# Patient Record
Sex: Female | Born: 1985 | Race: White | Hispanic: No | Marital: Single | Smoking: Current some day smoker
Health system: Southern US, Community
[De-identification: ages and names within clinical notes are randomized; demographics above are authoritative.]

## PROBLEM LIST (undated history)

## (undated) ENCOUNTER — Inpatient Hospital Stay (HOSPITAL_COMMUNITY): Payer: Self-pay

## (undated) DIAGNOSIS — F419 Anxiety disorder, unspecified: Secondary | ICD-10-CM

## (undated) DIAGNOSIS — G8929 Other chronic pain: Secondary | ICD-10-CM

## (undated) DIAGNOSIS — M549 Dorsalgia, unspecified: Secondary | ICD-10-CM

## (undated) DIAGNOSIS — R569 Unspecified convulsions: Secondary | ICD-10-CM

## (undated) HISTORY — PX: COSMETIC SURGERY: SHX468

## (undated) HISTORY — PX: BREAST SURGERY: SHX581

---
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
## 2005-04-13 ENCOUNTER — Emergency Department (HOSPITAL_COMMUNITY): Admission: EM | Admit: 2005-04-13 | Discharge: 2005-04-13 | Payer: Self-pay | Admitting: *Deleted

## 2007-04-18 ENCOUNTER — Inpatient Hospital Stay (HOSPITAL_COMMUNITY): Admission: AD | Admit: 2007-04-18 | Discharge: 2007-04-18 | Payer: Self-pay | Admitting: Obstetrics & Gynecology

## 2007-04-21 ENCOUNTER — Inpatient Hospital Stay (HOSPITAL_COMMUNITY): Admission: AD | Admit: 2007-04-21 | Discharge: 2007-04-21 | Payer: Self-pay | Admitting: Gynecology

## 2007-07-19 ENCOUNTER — Emergency Department (HOSPITAL_COMMUNITY): Admission: EM | Admit: 2007-07-19 | Discharge: 2007-07-19 | Payer: Self-pay | Admitting: Emergency Medicine

## 2007-11-23 ENCOUNTER — Inpatient Hospital Stay (HOSPITAL_COMMUNITY): Admission: AD | Admit: 2007-11-23 | Discharge: 2007-11-23 | Payer: Self-pay | Admitting: Obstetrics & Gynecology

## 2007-11-26 ENCOUNTER — Inpatient Hospital Stay (HOSPITAL_COMMUNITY): Admission: AD | Admit: 2007-11-26 | Discharge: 2007-11-26 | Payer: Self-pay | Admitting: Obstetrics & Gynecology

## 2007-12-04 ENCOUNTER — Inpatient Hospital Stay (HOSPITAL_COMMUNITY): Admission: RE | Admit: 2007-12-04 | Discharge: 2007-12-04 | Payer: Self-pay | Admitting: Obstetrics & Gynecology

## 2008-03-08 ENCOUNTER — Ambulatory Visit (HOSPITAL_COMMUNITY): Admission: RE | Admit: 2008-03-08 | Discharge: 2008-03-08 | Payer: Self-pay | Admitting: Obstetrics & Gynecology

## 2008-06-27 ENCOUNTER — Ambulatory Visit: Payer: Self-pay | Admitting: Vascular Surgery

## 2008-06-27 ENCOUNTER — Ambulatory Visit (HOSPITAL_COMMUNITY): Admission: RE | Admit: 2008-06-27 | Discharge: 2008-06-27 | Payer: Self-pay | Admitting: Obstetrics & Gynecology

## 2008-06-27 ENCOUNTER — Encounter: Payer: Self-pay | Admitting: Obstetrics & Gynecology

## 2008-07-28 ENCOUNTER — Inpatient Hospital Stay (HOSPITAL_COMMUNITY): Admission: AD | Admit: 2008-07-28 | Discharge: 2008-07-28 | Payer: Self-pay | Admitting: Obstetrics

## 2008-07-28 ENCOUNTER — Inpatient Hospital Stay (HOSPITAL_COMMUNITY): Admission: AD | Admit: 2008-07-28 | Discharge: 2008-07-31 | Payer: Self-pay | Admitting: Obstetrics

## 2009-03-21 ENCOUNTER — Emergency Department (HOSPITAL_COMMUNITY): Admission: EM | Admit: 2009-03-21 | Discharge: 2009-03-22 | Payer: Self-pay | Admitting: Emergency Medicine

## 2010-07-03 LAB — CBC
HCT: 36.2 % (ref 36.0–46.0)
Hemoglobin: 12.6 g/dL (ref 12.0–15.0)
MCHC: 34.7 g/dL (ref 30.0–36.0)
MCHC: 34.9 g/dL (ref 30.0–36.0)
MCV: 97.9 fL (ref 78.0–100.0)
MCV: 98.4 fL (ref 78.0–100.0)
Platelets: 167 10*3/uL (ref 150–400)
Platelets: 245 10*3/uL (ref 150–400)
RBC: 3.67 MIL/uL — ABNORMAL LOW (ref 3.87–5.11)
RDW: 13.9 % (ref 11.5–15.5)
WBC: 11.6 10*3/uL — ABNORMAL HIGH (ref 4.0–10.5)

## 2010-07-04 ENCOUNTER — Emergency Department (HOSPITAL_COMMUNITY)
Admission: EM | Admit: 2010-07-04 | Discharge: 2010-07-05 | Disposition: A | Discharge status: Home / Self Care | Payer: Self-pay | Attending: Emergency Medicine | Admitting: Emergency Medicine

## 2010-07-04 DIAGNOSIS — R262 Difficulty in walking, not elsewhere classified: Secondary | ICD-10-CM | POA: Insufficient documentation

## 2010-07-04 DIAGNOSIS — M546 Pain in thoracic spine: Secondary | ICD-10-CM | POA: Insufficient documentation

## 2010-07-04 DIAGNOSIS — G40909 Epilepsy, unspecified, not intractable, without status epilepticus: Secondary | ICD-10-CM | POA: Insufficient documentation

## 2010-07-04 DIAGNOSIS — Z79899 Other long term (current) drug therapy: Secondary | ICD-10-CM | POA: Insufficient documentation

## 2010-07-04 DIAGNOSIS — H6690 Otitis media, unspecified, unspecified ear: Secondary | ICD-10-CM | POA: Insufficient documentation

## 2010-07-09 ENCOUNTER — Other Ambulatory Visit: Payer: Self-pay | Admitting: Orthopedic Surgery

## 2010-07-09 DIAGNOSIS — M545 Low back pain: Secondary | ICD-10-CM

## 2010-07-09 DIAGNOSIS — M549 Dorsalgia, unspecified: Secondary | ICD-10-CM

## 2010-07-16 ENCOUNTER — Other Ambulatory Visit: Payer: Self-pay

## 2010-07-21 ENCOUNTER — Ambulatory Visit
Admission: RE | Admit: 2010-07-21 | Discharge: 2010-07-21 | Disposition: A | Discharge status: Home / Self Care | Payer: Self-pay | Source: Ambulatory Visit | Attending: Orthopedic Surgery | Admitting: Orthopedic Surgery

## 2010-07-21 DIAGNOSIS — M545 Low back pain: Secondary | ICD-10-CM

## 2010-07-21 DIAGNOSIS — M549 Dorsalgia, unspecified: Secondary | ICD-10-CM

## 2010-09-14 ENCOUNTER — Emergency Department (HOSPITAL_COMMUNITY)
Admission: EM | Admit: 2010-09-14 | Discharge: 2010-09-14 | Disposition: A | Discharge status: Home / Self Care | Payer: BC Managed Care – PPO | Attending: Emergency Medicine | Admitting: Emergency Medicine

## 2010-09-14 ENCOUNTER — Emergency Department (HOSPITAL_COMMUNITY): Payer: BC Managed Care – PPO

## 2010-09-14 DIAGNOSIS — M62838 Other muscle spasm: Secondary | ICD-10-CM | POA: Insufficient documentation

## 2010-09-14 DIAGNOSIS — M25579 Pain in unspecified ankle and joints of unspecified foot: Secondary | ICD-10-CM | POA: Insufficient documentation

## 2010-09-14 DIAGNOSIS — M549 Dorsalgia, unspecified: Secondary | ICD-10-CM | POA: Insufficient documentation

## 2010-09-14 DIAGNOSIS — G8929 Other chronic pain: Secondary | ICD-10-CM | POA: Insufficient documentation

## 2010-12-14 LAB — URINALYSIS, ROUTINE W REFLEX MICROSCOPIC
Bilirubin Urine: NEGATIVE
Glucose, UA: NEGATIVE
Hgb urine dipstick: NEGATIVE
Ketones, ur: NEGATIVE
Nitrite: NEGATIVE
Protein, ur: NEGATIVE
Specific Gravity, Urine: >1.03 — ABNORMAL HIGH
Urobilinogen, UA: 0.2
pH: 5.5

## 2010-12-14 LAB — GC/CHLAMYDIA PROBE AMP, GENITAL
Chlamydia, DNA Probe: NEGATIVE
GC Probe Amp, Genital: NEGATIVE

## 2010-12-14 LAB — WET PREP, GENITAL
Clue Cells Wet Prep HPF POC: NONE SEEN
Trich, Wet Prep: NONE SEEN
Yeast Wet Prep HPF POC: NONE SEEN

## 2010-12-14 LAB — POCT PREGNANCY, URINE
Operator id: 127531
Preg Test, Ur: POSITIVE

## 2010-12-14 LAB — CBC
HCT: 35.9 — ABNORMAL LOW
Hemoglobin: 12.6
Platelets: 280
RDW: 12.2

## 2010-12-14 LAB — ABO/RH: ABO/RH(D): O POS

## 2012-04-09 ENCOUNTER — Encounter (HOSPITAL_COMMUNITY): Payer: Self-pay

## 2012-04-09 ENCOUNTER — Emergency Department (HOSPITAL_COMMUNITY)
Admission: EM | Admit: 2012-04-09 | Discharge: 2012-04-09 | Disposition: A | Discharge status: Home / Self Care | Payer: Self-pay | Attending: Emergency Medicine | Admitting: Emergency Medicine

## 2012-04-09 DIAGNOSIS — F17200 Nicotine dependence, unspecified, uncomplicated: Secondary | ICD-10-CM | POA: Insufficient documentation

## 2012-04-09 DIAGNOSIS — M545 Low back pain, unspecified: Secondary | ICD-10-CM | POA: Insufficient documentation

## 2012-04-09 DIAGNOSIS — F411 Generalized anxiety disorder: Secondary | ICD-10-CM | POA: Insufficient documentation

## 2012-04-09 DIAGNOSIS — Z760 Encounter for issue of repeat prescription: Secondary | ICD-10-CM | POA: Insufficient documentation

## 2012-04-09 DIAGNOSIS — Z79899 Other long term (current) drug therapy: Secondary | ICD-10-CM | POA: Insufficient documentation

## 2012-04-09 DIAGNOSIS — G8929 Other chronic pain: Secondary | ICD-10-CM | POA: Insufficient documentation

## 2012-04-09 DIAGNOSIS — Z9889 Other specified postprocedural states: Secondary | ICD-10-CM | POA: Insufficient documentation

## 2012-04-09 HISTORY — DX: Anxiety disorder, unspecified: F41.9

## 2012-04-09 HISTORY — DX: Dorsalgia, unspecified: M54.9

## 2012-04-09 HISTORY — DX: Other chronic pain: G89.29

## 2012-04-09 MED ORDER — DIAZEPAM 5 MG PO TABS
5.0000 mg | ORAL_TABLET | Freq: Once | ORAL | Status: AC
  Administered 2012-04-09: 5 mg via ORAL
  Filled 2012-04-09: qty 1

## 2012-04-09 MED ORDER — IBUPROFEN 200 MG PO TABS
600.0000 mg | ORAL_TABLET | Freq: Once | ORAL | Status: AC
  Administered 2012-04-09: 600 mg via ORAL
  Filled 2012-04-09: qty 1

## 2012-04-09 MED ORDER — GABAPENTIN 800 MG PO TABS: 800.0000 mg | ORAL_TABLET | Freq: Three times a day (TID) | ORAL | Status: DC

## 2012-04-09 MED ORDER — HYDROMORPHONE HCL PF 1 MG/ML IJ SOLN
1.0000 mg | Freq: Once | INTRAMUSCULAR | Status: AC
  Administered 2012-04-09: 1 mg via INTRAMUSCULAR
  Filled 2012-04-09: qty 1

## 2012-04-09 NOTE — ED Notes (Signed)
Pt c/o chronic back pain. Needs refill for pain rx. Pt states current PCP unable to prescribe her requested medication oxycodone 30mg  and was told to come to ED. She is waiting to be seen at pain center in possible 2-3 weeks. A&Ox4, ambulatory, nad.

## 2012-04-09 NOTE — ED Notes (Addendum)
Pt presents with chronic thoracic and lumbar pain.  Pt reports she was just at her physicians' office, denied prescription for oxycodone 30mg  until she can get into pain clinic.  Pt denies any recent injury, denies any bowel or fecal incontinence.  Pain medication last taken yesterday morning.

## 2012-04-09 NOTE — ED Provider Notes (Signed)
History    27 year old female with lower back pain. Chronic in nature and has been bothering her for a period of several years. Went to her primary care physician's office today to get a refill of her oxycodone, 30 mg, but was denied and reports she was referred to the emergency room. She denies any acute trauma. No urinary complaints. No acute numbness, tingling or loss of strength. No use of blood thinning medication. Denies history of IV drug use.  CSN: [id number]  Arrival date & time 04/09/12  1259   First MD Initiated Contact with Patient 04/09/12 1404      Chief Complaint  Patient presents with  . Back Pain    (Consider location/radiation/quality/duration/timing/severity/associated sxs/prior treatment) HPI  Past Medical History  Diagnosis Date  . Back pain, chronic   . Anxiety     Past Surgical History  Procedure Date  . Back surgery   . Cosmetic surgery     History reviewed. No pertinent family history.  History  Substance Use Topics  . Smoking status: Current Some Day Smoker  . Smokeless tobacco: Not on file  . Alcohol Use: No    OB History    Grav Para Term Preterm Abortions TAB SAB Ect Mult Living                  Review of Systems  All systems reviewed and negative, other than as noted in HPI.\  Allergies  Acetaminophen  Home Medications   Current Outpatient Rx  Name  Route  Sig  Dispense  Refill  . ALPRAZOLAM 1 MG PO TABS   Oral   Take 1 mg by mouth 2 (two) times daily as needed. For anxiety         . ADULT MULTIVITAMIN W/MINERALS CH   Oral   Take 1 tablet by mouth daily.         Marland Kitchen GABAPENTIN 800 MG PO TABS   Oral   Take 1 tablet (800 mg total) by mouth 3 (three) times daily.   90 tablet   0     BP 128/91  Pulse 79  Temp 98.2 F (36.8 C) (Oral)  Resp 16  SpO2 100%  LMP 03/18/2012  Physical Exam  Nursing note and vitals reviewed. Constitutional: She appears well-developed and well-nourished. No distress.  HENT:  Head:  Normocephalic and atraumatic.  Eyes: Conjunctivae normal are normal. Right eye exhibits no discharge. Left eye exhibits no discharge.  Neck: Neck supple.  Cardiovascular: Normal rate, regular rhythm and normal heart sounds.  Exam reveals no gallop and no friction rub.   No murmur heard. Pulmonary/Chest: Effort normal and breath sounds normal. No respiratory distress.  Abdominal: Soft. She exhibits no distension. There is no tenderness.  Musculoskeletal: She exhibits no edema and no tenderness.       Pain is not reproducible. Tattoo in lumbosacral region, but no concerning skin changes noted.  Neurological: She is alert. She exhibits normal muscle tone. Coordination normal.       Strength 5 out of 5 bilateral lower extremities. Sensation is intact to light touch. Patellar reflexes 1+ bilaterally  Skin: Skin is warm and dry.  Psychiatric: She has a normal mood and affect. Her behavior is normal. Thought content normal.    ED Course  Procedures (including critical care time)  Labs Reviewed - No data to display No results found.   1. Chronic back pain   2. Medication refill       MDM  26  rolled female with chronic lower back pain without acute change. No concerning "red flags." History resting refill for narcotic pain medicine. Discussed with patient that I do not write prescriptions for her chronic narcotics in that she needs to followup with pain management as apparently was recommended by her PCP. She is in no acute distress. She did receive a dose of pain medications in the emergency room though. Also prescribed neurontin as she reports that she's previously been on this dose with some relief in the past. Emergent return precautions were discussed. Outpatient followup otherwise.        Raeford Razor, MD 04/09/12 [phone]

## 2012-04-27 ENCOUNTER — Ambulatory Visit: Payer: Self-pay | Admitting: Family Medicine

## 2012-04-27 ENCOUNTER — Telehealth: Payer: Self-pay | Admitting: Family Medicine

## 2012-04-27 VITALS — BP 121/87 | HR 77 | Temp 98.2°F | Resp 18 | Ht 65.0 in | Wt 133.0 lb

## 2012-04-27 DIAGNOSIS — M549 Dorsalgia, unspecified: Secondary | ICD-10-CM

## 2012-04-27 DIAGNOSIS — G8929 Other chronic pain: Secondary | ICD-10-CM

## 2012-04-27 MED ORDER — OXYCODONE HCL 30 MG PO TABS: 30.0000 mg | ORAL_TABLET | Freq: Four times a day (QID) | ORAL | Status: DC | PRN

## 2012-04-27 NOTE — Telephone Encounter (Signed)
[facility] called regarding pts RX for oxycodone. They will not be able to fill this for her. There is a note in there system that states Rx did not meet good faith dispensing on 06/2011. She has been getting this RX in [place], [place], and even in [place]. They will have to have patient fill somewhere else.
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
## 2012-05-27 ENCOUNTER — Emergency Department (HOSPITAL_COMMUNITY)
Admission: EM | Admit: 2012-05-27 | Discharge: 2012-05-27 | Disposition: A | Discharge status: Home / Self Care | Payer: Self-pay | Attending: Emergency Medicine | Admitting: Emergency Medicine

## 2012-05-27 ENCOUNTER — Encounter (HOSPITAL_COMMUNITY): Payer: Self-pay | Admitting: Emergency Medicine

## 2012-05-27 DIAGNOSIS — F411 Generalized anxiety disorder: Secondary | ICD-10-CM | POA: Insufficient documentation

## 2012-05-27 DIAGNOSIS — Z9889 Other specified postprocedural states: Secondary | ICD-10-CM | POA: Insufficient documentation

## 2012-05-27 DIAGNOSIS — F17200 Nicotine dependence, unspecified, uncomplicated: Secondary | ICD-10-CM | POA: Insufficient documentation

## 2012-05-27 DIAGNOSIS — Z87828 Personal history of other (healed) physical injury and trauma: Secondary | ICD-10-CM | POA: Insufficient documentation

## 2012-05-27 DIAGNOSIS — G8929 Other chronic pain: Secondary | ICD-10-CM | POA: Insufficient documentation

## 2012-05-27 DIAGNOSIS — M549 Dorsalgia, unspecified: Secondary | ICD-10-CM | POA: Insufficient documentation

## 2012-05-27 DIAGNOSIS — R209 Unspecified disturbances of skin sensation: Secondary | ICD-10-CM | POA: Insufficient documentation

## 2012-05-27 MED ORDER — OXYCODONE HCL ER 10 MG PO T12A
10.0000 mg | EXTENDED_RELEASE_TABLET | Freq: Two times a day (BID) | ORAL | Status: DC
  Administered 2012-05-27: 10 mg via ORAL
  Filled 2012-05-27: qty 1

## 2012-05-27 NOTE — ED Provider Notes (Signed)
  Medical screening examination/treatment/procedure(s) were performed by non-physician practitioner and as supervising physician I was immediately available for consultation/collaboration.    Vida Roller, MD 05/27/12 [phone]
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
## 2012-05-27 NOTE — ED Notes (Signed)
Pt states she was told to come her for medication refill of Oxycodone 30mg  for chronic back pain until she can get into the pain clinic.

## 2012-06-23 ENCOUNTER — Emergency Department (HOSPITAL_COMMUNITY)
Admission: EM | Admit: 2012-06-23 | Discharge: 2012-06-23 | Disposition: A | Discharge status: Home / Self Care | Payer: Self-pay | Attending: Emergency Medicine | Admitting: Emergency Medicine

## 2012-06-23 ENCOUNTER — Encounter (HOSPITAL_COMMUNITY): Payer: Self-pay | Admitting: Emergency Medicine

## 2012-06-23 DIAGNOSIS — M549 Dorsalgia, unspecified: Secondary | ICD-10-CM | POA: Insufficient documentation

## 2012-06-23 DIAGNOSIS — F19939 Other psychoactive substance use, unspecified with withdrawal, unspecified: Secondary | ICD-10-CM | POA: Insufficient documentation

## 2012-06-23 DIAGNOSIS — F17200 Nicotine dependence, unspecified, uncomplicated: Secondary | ICD-10-CM | POA: Insufficient documentation

## 2012-06-23 DIAGNOSIS — F13239 Sedative, hypnotic or anxiolytic dependence with withdrawal, unspecified: Secondary | ICD-10-CM

## 2012-06-23 DIAGNOSIS — F411 Generalized anxiety disorder: Secondary | ICD-10-CM | POA: Insufficient documentation

## 2012-06-23 DIAGNOSIS — G40909 Epilepsy, unspecified, not intractable, without status epilepticus: Secondary | ICD-10-CM | POA: Insufficient documentation

## 2012-06-23 DIAGNOSIS — Z79899 Other long term (current) drug therapy: Secondary | ICD-10-CM | POA: Insufficient documentation

## 2012-06-23 DIAGNOSIS — G8929 Other chronic pain: Secondary | ICD-10-CM | POA: Insufficient documentation

## 2012-06-23 DIAGNOSIS — R569 Unspecified convulsions: Secondary | ICD-10-CM

## 2012-06-23 MED ORDER — OXYCODONE-ACETAMINOPHEN 5-325 MG PO TABS
2.0000 | ORAL_TABLET | Freq: Once | ORAL | Status: AC
  Administered 2012-06-23: 2 via ORAL
  Filled 2012-06-23: qty 2

## 2012-06-23 MED ORDER — LORAZEPAM 1 MG PO TABS: 1.0000 mg | ORAL_TABLET | Freq: Every day | ORAL | Status: DC

## 2012-06-23 MED ORDER — LORAZEPAM 2 MG/ML IJ SOLN
1.0000 mg | Freq: Once | INTRAMUSCULAR | Status: AC
  Administered 2012-06-23: 1 mg via INTRAVENOUS
  Filled 2012-06-23: qty 1

## 2012-06-23 NOTE — ED Provider Notes (Signed)
History     CSN: [id number]  Arrival date & time 06/23/12  1147   First MD Initiated Contact with Patient 06/23/12 1205      Chief Complaint  Patient presents with  . Seizures    (Consider location/radiation/quality/duration/timing/severity/associated sxs/prior treatment) HPI Comments: Patients with history of anxiety, chronic pain presents with complaint of seizure. Patient just arrived at work and was found lying on the floor. EMS was called. She was postictal and confused upon EMS arrival. She bit her tongue but was not incontinent. 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 Patient thinks this was triggered by the same. She recently cut back on the amount of Xanax she was taking because she is almost out. Her last dose was yesterday 0.5 mg. She typically takes 1-2 mg every day. Patient denies chronic alcohol use. She currently does not have any symptoms or pain. No weakness, numbness, or tingling in her extremities. She complains of her baseline lower back pain. She has an abrasion above her right eye but denies vision changes or vomiting. No headache. Onset acute. Course is resolved. Nothing makes symptoms better.  The history is provided by the patient.    Past Medical History  Diagnosis Date  . Back pain, chronic   . Anxiety     Past Surgical History  Procedure Laterality Date  . Back surgery    . Cosmetic surgery    . Breast surgery      Family History  Problem Relation Age of Onset  . Diabetes Maternal Grandmother   . Kidney disease Maternal Grandmother     History  Substance Use Topics  . Smoking status: Current Some Day Smoker  . Smokeless tobacco: Not on file  . Alcohol Use: No    OB History   Grav Para Term Preterm Abortions TAB SAB Ect Mult Living                  Review of Systems  Constitutional: Negative for fever.  HENT: Negative for congestion, rhinorrhea, neck pain, neck stiffness, dental problem and sinus  pressure.   Eyes: Negative for photophobia, discharge, redness and visual disturbance.  Respiratory: Negative for shortness of breath.   Cardiovascular: Negative for chest pain.  Gastrointestinal: Negative for nausea and vomiting.  Musculoskeletal: Negative for gait problem.  Skin: Positive for wound (tongue biting). Negative for rash.  Neurological: Positive for seizures. Negative for syncope, speech difficulty, weakness, light-headedness, numbness and headaches.  Psychiatric/Behavioral: Negative for confusion.    Allergies  Acetaminophen  Home Medications   Current Outpatient Rx  Name  Route  Sig  Dispense  Refill  . ALPRAZolam (XANAX) 1 MG tablet   Oral   Take 1 mg by mouth 2 (two) times daily as needed. For anxiety         . gabapentin (NEURONTIN) 800 MG tablet   Oral   Take 800 mg by mouth 3 (three) times daily as needed (For pain.).         Marland Kitchen Multiple Vitamin (MULTIVITAMIN WITH MINERALS) TABS   Oral   Take 1 tablet by mouth every morning.          Marland Kitchen oxycodone (ROXICODONE) 30 MG immediate release tablet   Oral   Take 30 mg by mouth every 6 (six) hours as needed for pain.           BP 133/92  Pulse 110  Temp(Src) 98.4 F (36.9 C) (Oral)  Resp 20  SpO2 100%  LMP 06/10/2012  Physical Exam  Nursing note and vitals reviewed. Constitutional: She is oriented to person, place, and time. She appears well-developed and well-nourished.  HENT:  Head: Normocephalic and atraumatic.  Right Ear: Tympanic membrane, external ear and ear canal normal.  Left Ear: Tympanic membrane, external ear and ear canal normal.  Nose: Nose normal.  Mouth/Throat: Uvula is midline, oropharynx is clear and moist and mucous membranes are normal.  Abrasions bilateral lateral tongue  Eyes: Conjunctivae, EOM and lids are normal. Pupils are equal, round, and reactive to light. Right eye exhibits no discharge. Left eye exhibits no discharge. Right eye exhibits no nystagmus. Left eye  exhibits no nystagmus.  Neck: Normal range of motion. Neck supple.  Cardiovascular: Normal rate, regular rhythm and normal heart sounds.   Pulmonary/Chest: Effort normal and breath sounds normal.  Abdominal: Soft. There is no tenderness.  Musculoskeletal:       Cervical back: She exhibits normal range of motion, no tenderness and no bony tenderness.  Neurological: She is alert and oriented to person, place, and time. She has normal strength and normal reflexes. No cranial nerve deficit or sensory deficit. She displays a negative Romberg sign. Coordination and gait normal. GCS eye subscore is 4. GCS verbal subscore is 5. GCS motor subscore is 6.  Skin: Skin is warm and dry.  Psychiatric: She has a normal mood and affect.    ED Course  Procedures (including critical care time)  Labs Reviewed - No data to display No results found.   1. Seizure   2. Benzodiazepine withdrawal     1:12 PM Patient seen and examined. Work-up initiated. Medications ordered.   Vital signs reviewed and are as follows: Filed Vitals:   06/23/12 1253  BP:   Pulse:   Temp: 98.8 F (37.1 C)  Resp:   BP 133/92  Pulse 110  Temp(Src) 98.8 F (37.1 C) (Oral)  Resp 20  SpO2 100%  LMP 06/10/2012  2:58 PM Patient d/w Dr. Blinda Leatherwood. Patient has not had additional seizure activity. HR 100. Will give ativan for home. Urged to f/u with PCP ASAP for benzo mgmt.   Patient requested pain medication since she is out. Will not rx given h/o chronic pain. This will need to be managed by PCP.    MDM  Seizure, most likely secondary to benzodiazepine withdrawal. Patient with full return to baseline in emergency department. No further seizure activity noted. Neurological exam is normal. Ativan 1mg  #15 provided to prevent further withdrawal seizures.          Renne Crigler, PA-C 06/23/12 1501

## 2012-06-23 NOTE — ED Notes (Addendum)
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 EMS stated "pt seemed post ictal.  Pt has hx of withdrawal seizures"  Pt reports she doesn't remember what happened.  Arrived to ED with a small abrasion above r eye. Pt reports that previous seizures have been related to xanax withdrawal and has recently ran out of xanax.  Pt is alert and oriented.

## 2012-06-23 NOTE — ED Notes (Signed)
LKG:MW10<UV> Expected date:<BR> Expected time:<BR> Means of arrival:<BR> Comments:<BR> 27 y/o F postictal/ tachy @ 120

## 2012-06-25 NOTE — ED Provider Notes (Signed)
Medical screening examination/treatment/procedure(s) were performed by non-physician practitioner and as supervising physician I was immediately available for consultation/collaboration.  Gilda Crease, MD 06/25/12 1300

## 2012-07-27 ENCOUNTER — Emergency Department (HOSPITAL_COMMUNITY)
Admission: EM | Admit: 2012-07-27 | Discharge: 2012-07-27 | Disposition: A | Discharge status: Home / Self Care | Payer: Self-pay | Attending: Emergency Medicine | Admitting: Emergency Medicine

## 2012-07-27 ENCOUNTER — Encounter (HOSPITAL_COMMUNITY): Payer: Self-pay | Admitting: Emergency Medicine

## 2012-07-27 DIAGNOSIS — F19939 Other psychoactive substance use, unspecified with withdrawal, unspecified: Secondary | ICD-10-CM | POA: Insufficient documentation

## 2012-07-27 DIAGNOSIS — M549 Dorsalgia, unspecified: Secondary | ICD-10-CM | POA: Insufficient documentation

## 2012-07-27 DIAGNOSIS — G8929 Other chronic pain: Secondary | ICD-10-CM | POA: Insufficient documentation

## 2012-07-27 DIAGNOSIS — F17200 Nicotine dependence, unspecified, uncomplicated: Secondary | ICD-10-CM | POA: Insufficient documentation

## 2012-07-27 DIAGNOSIS — G43909 Migraine, unspecified, not intractable, without status migrainosus: Secondary | ICD-10-CM | POA: Insufficient documentation

## 2012-07-27 DIAGNOSIS — Z760 Encounter for issue of repeat prescription: Secondary | ICD-10-CM | POA: Insufficient documentation

## 2012-07-27 DIAGNOSIS — Z79899 Other long term (current) drug therapy: Secondary | ICD-10-CM | POA: Insufficient documentation

## 2012-07-27 MED ORDER — CETIRIZINE HCL 10 MG PO TABS: 10.0000 mg | ORAL_TABLET | Freq: Every day | ORAL | Status: DC

## 2012-07-27 MED ORDER — KETOROLAC TROMETHAMINE 60 MG/2ML IM SOLN
60.0000 mg | Freq: Once | INTRAMUSCULAR | Status: AC
  Administered 2012-07-27: 60 mg via INTRAMUSCULAR
  Filled 2012-07-27: qty 2

## 2012-07-27 MED ORDER — LORAZEPAM 1 MG PO TABS
1.0000 mg | ORAL_TABLET | Freq: Once | ORAL | Status: AC
  Administered 2012-07-27: 1 mg via ORAL
  Filled 2012-07-27: qty 1

## 2012-07-27 MED ORDER — ALPRAZOLAM 1 MG PO TABS: 1.0000 mg | ORAL_TABLET | Freq: Three times a day (TID) | ORAL | Status: DC | PRN

## 2012-07-27 NOTE — ED Provider Notes (Signed)
History    This chart was scribed for non-physician practitioner Jaynie Crumble, PA-C working with Toy Baker, MD by Gerlean Ren, ED Scribe. This patient was seen in room WTR8/WTR8 and the patient's care was started at 11:15 PM.    CSN: [id number]  Arrival date & time 07/27/12  2231   First MD Initiated Contact with Patient 07/27/12 2252      Chief Complaint  Patient presents with  . Anxiety  . Medication Refill     The history is provided by the patient. No language interpreter was used.  Colleen Evans is a 27 y.o. female who presents to the Emergency Department complaining of anxiety with associated dizziness since running out of Xanax yesterday.  Pt normally takes 2-3 1mg  Xanax daily.  Xanax normally prescribed by psychiatrist.  Pt states she "does not know what happened" to her Xanax.  Pt reports h/o withdrawal seizures which have not occurred with current symptoms.  Next appointment with psychiatrist is 05/17.   Pt also c/o 3 months of occasional throbbing HA with associated photophobia, watery eyes, and congestion that she thinks are related to allergies.    No PCP. Past Medical History  Diagnosis Date  . Back pain, chronic   . Anxiety     Past Surgical History  Procedure Laterality Date  . Back surgery    . Cosmetic surgery    . Breast surgery      Family History  Problem Relation Age of Onset  . Diabetes Maternal Grandmother   . Kidney disease Maternal Grandmother     History  Substance Use Topics  . Smoking status: Current Some Day Smoker  . Smokeless tobacco: Not on file  . Alcohol Use: No    No OB history provided.   Review of Systems  Constitutional: Negative for fever and chills.  HENT: Positive for congestion.   Eyes: Positive for photophobia.       Positive watery eyes  Respiratory: Negative for shortness of breath.   Gastrointestinal: Negative for nausea and vomiting.  Neurological: Positive for dizziness and headaches. Negative for  weakness.  Psychiatric/Behavioral: The patient is nervous/anxious.   All other systems reviewed and are negative.    Allergies  Acetaminophen  Home Medications   Current Outpatient Rx  Name  Route  Sig  Dispense  Refill  . ALPRAZolam (XANAX) 1 MG tablet   Oral   Take 1 mg by mouth 3 (three) times daily as needed for anxiety. For anxiety         . amphetamine-dextroamphetamine (ADDERALL) 20 MG tablet   Oral   Take 20 mg by mouth daily.         . Multiple Vitamin (MULTIVITAMIN WITH MINERALS) TABS   Oral   Take 1 tablet by mouth every morning.            BP 133/87  Pulse 74  Temp(Src) 98.6 F (37 C) (Oral)  SpO2 100%  LMP 07/05/2012  Physical Exam  Nursing note and vitals reviewed. Constitutional: She is oriented to person, place, and time. She appears well-developed and well-nourished.  HENT:  Head: Normocephalic and atraumatic.  Mouth/Throat: Oropharynx is clear and moist.  Bilateral TM normal  Eyes: Conjunctivae and EOM are normal. Pupils are equal, round, and reactive to light.  Neck: Normal range of motion. Neck supple. No tracheal deviation present.  Cardiovascular: Normal rate, regular rhythm and normal heart sounds.   Pulmonary/Chest: Effort normal. No respiratory distress. She has no wheezes. She  has no rales.  Musculoskeletal: Normal range of motion.  Neurological: She is alert and oriented to person, place, and time.  5/5 and equal upper and lower extremity strength bilaterally. Equal grip strength bilaterally. Normal finger to nose and heel to shin. No pronator drift.   Skin: Skin is warm and dry.  Psychiatric:  Pt is tearful, appears anxious    ED Course  Procedures (including critical care time) DIAGNOSTIC STUDIES: Oxygen Saturation is 100% on room air, normal by my interpretation.    COORDINATION OF CARE: 11:21 PM- Offered pt IM Toradol for HA and she accepts.  Informed pt I can only write short term Xanax.  Advised pt to make contact with  PCP.  Pt verbalizes understanding and agrees with plan.    1. Migraine   2. Medication withdrawal       MDM  Pt states she does not know where her xanax is. States her apt is not for another week. She has hx of seizures when withdrawing from benzos and worried will have another seizure. Pt also admits to seasonal allergies and headaches. Pt non toxic. Normal neuro exam. She does appear anxious and tearful. Will refill small number of xanax, also wills tart on zyrtec for allergies, follow up with PCP.   Filed Vitals:   07/27/12 2246  BP: 133/87  Pulse: 74  Temp: 98.6 F (37 C)  TempSrc: Oral  SpO2: 100%     I personally performed the services described in this documentation, which was scribed in my presence. The recorded information has been reviewed and is accurate.    Lottie Mussel, PA-C 07/28/12 [phone]

## 2012-07-27 NOTE — ED Notes (Signed)
Pt states that she is out of her Xanax, hasn't had one since yesterday and states hx of withdrawal seizures.

## 2012-07-29 NOTE — ED Provider Notes (Signed)
Medical screening examination/treatment/procedure(s) were performed by non-physician practitioner and as supervising physician I was immediately available for consultation/collaboration.  Jeffrie Lofstrom, MD 07/29/12 0055 

## 2012-10-20 ENCOUNTER — Emergency Department (HOSPITAL_COMMUNITY)
Admission: EM | Admit: 2012-10-20 | Discharge: 2012-10-20 | Disposition: A | Discharge status: Home / Self Care | Payer: Self-pay | Attending: Emergency Medicine | Admitting: Emergency Medicine

## 2012-10-20 ENCOUNTER — Encounter (HOSPITAL_COMMUNITY): Payer: Self-pay | Admitting: Emergency Medicine

## 2012-10-20 DIAGNOSIS — M549 Dorsalgia, unspecified: Secondary | ICD-10-CM | POA: Insufficient documentation

## 2012-10-20 DIAGNOSIS — F411 Generalized anxiety disorder: Secondary | ICD-10-CM | POA: Insufficient documentation

## 2012-10-20 DIAGNOSIS — F19939 Other psychoactive substance use, unspecified with withdrawal, unspecified: Secondary | ICD-10-CM | POA: Insufficient documentation

## 2012-10-20 DIAGNOSIS — F13239 Sedative, hypnotic or anxiolytic dependence with withdrawal, unspecified: Secondary | ICD-10-CM

## 2012-10-20 DIAGNOSIS — Z79899 Other long term (current) drug therapy: Secondary | ICD-10-CM | POA: Insufficient documentation

## 2012-10-20 DIAGNOSIS — F17200 Nicotine dependence, unspecified, uncomplicated: Secondary | ICD-10-CM | POA: Insufficient documentation

## 2012-10-20 DIAGNOSIS — R569 Unspecified convulsions: Secondary | ICD-10-CM | POA: Insufficient documentation

## 2012-10-20 DIAGNOSIS — G8929 Other chronic pain: Secondary | ICD-10-CM | POA: Insufficient documentation

## 2012-10-20 DIAGNOSIS — F1320 Sedative, hypnotic or anxiolytic dependence, uncomplicated: Secondary | ICD-10-CM | POA: Insufficient documentation

## 2012-10-20 HISTORY — DX: Unspecified convulsions: R56.9

## 2012-10-20 LAB — POCT I-STAT, CHEM 8
BUN: 6 mg/dL (ref 6–23)
Calcium, Ion: 1.15 mmol/L (ref 1.12–1.23)
Chloride: 105 mEq/L (ref 96–112)
HCT: 42 % (ref 36.0–46.0)
Potassium: 3.6 mEq/L (ref 3.5–5.1)

## 2012-10-20 MED ORDER — ALPRAZOLAM 1 MG PO TABS: 1.0000 mg | ORAL_TABLET | Freq: Every evening | ORAL | Status: DC | PRN

## 2012-10-20 MED ORDER — IBUPROFEN 800 MG PO TABS
800.0000 mg | ORAL_TABLET | Freq: Once | ORAL | Status: AC
  Administered 2012-10-20: 800 mg via ORAL

## 2012-10-20 MED ORDER — IBUPROFEN 800 MG PO TABS
800.0000 mg | ORAL_TABLET | Freq: Once | ORAL | Status: DC
  Filled 2012-10-20: qty 1

## 2012-10-20 MED ORDER — LORAZEPAM 2 MG/ML IJ SOLN
1.0000 mg | Freq: Once | INTRAMUSCULAR | Status: AC
  Administered 2012-10-20: 1 mg via INTRAVENOUS
  Filled 2012-10-20: qty 1

## 2012-10-20 NOTE — ED Notes (Signed)
Pt was at work when she had a witnessed seizure. Witnesses state pt passed out and hit head on floor. Small bruise noted above L eyebrow. Pt states last seizure was almost a year ago. Was dx with seizures 7 years ago. Not on medications for seizures.

## 2012-10-20 NOTE — ED Provider Notes (Signed)
Medical screening examination/treatment/procedure(s) were performed by non-physician practitioner and as supervising physician I was immediately available for consultation/collaboration.   Treson Laura, MD 10/20/12 2304 

## 2012-10-20 NOTE — ED Notes (Signed)
[id number]<AV> Expected date:<BR> Expected time:<BR> Means of arrival:<BR> Comments:<BR> EMS-sz

## 2012-10-20 NOTE — ED Provider Notes (Signed)
CSN: [id number]     Arrival date & time 10/20/12  1531 History     First MD Initiated Contact with Patient 10/20/12 1539     Chief Complaint  Patient presents with  . Seizures   HPI  History provided by the patient. Patient is a 27 year old female with history of anxiety and chronic back pain presents after reported seizure at work. Patient was at work does not recall what happened but remembers waking up surrounded by EMS and being loaded into the ambulance. Patient does report having a past history of a seizure that she believes was over a year ago. Patient denies having seizure disorder states she was told this was caused from her medications. Patient did run out of her Xanax yesterday. She normally takes 1 mg 3 times a day. She has not taken any today. She is not a regular alcohol user. Patient does report some soreness to her right elbow after the fall. Denies any pain to the head or neck. She has some pain in her back but states this feels at baseline for her chronic pain. She also complains of some pain to her left ankle this has been increasing for the past several days and she reports having a stress fracture in her foot. Patient denies having any urinary or fecal incontinence. She did have a slight bite to her tongue on the left side. Denies any bleeding in her mouth. No episodes of vomiting. No other aggravating or alleviating factors. No other associated symptoms.      Past Medical History  Diagnosis Date  . Back pain, chronic   . Anxiety   . Seizures    Past Surgical History  Procedure Laterality Date  . Back surgery    . Cosmetic surgery    . Breast surgery     Family History  Problem Relation Age of Onset  . Diabetes Maternal Grandmother   . Kidney disease Maternal Grandmother    History  Substance Use Topics  . Smoking status: Current Some Day Smoker  . Smokeless tobacco: Not on file  . Alcohol Use: No   OB History   Grav Para Term Preterm Abortions TAB SAB  Ect Mult Living                 Review of Systems  Constitutional: Negative for fever.  HENT: Negative for neck pain.   Respiratory: Negative for shortness of breath.   Cardiovascular: Negative for chest pain.  Musculoskeletal: Positive for back pain.  Neurological: Negative for light-headedness and headaches.  All other systems reviewed and are negative.    Allergies  Acetaminophen  Home Medications   Current Outpatient Rx  Name  Route  Sig  Dispense  Refill  . ALPRAZolam (XANAX) 1 MG tablet   Oral   Take 1 mg by mouth 3 (three) times daily as needed for anxiety. For anxiety         . ALPRAZolam (XANAX) 1 MG tablet   Oral   Take 1 tablet (1 mg total) by mouth 3 (three) times daily as needed for sleep.   15 tablet   0   . amphetamine-dextroamphetamine (ADDERALL) 20 MG tablet   Oral   Take 20 mg by mouth daily.         . cetirizine (ZYRTEC ALLERGY) 10 MG tablet   Oral   Take 1 tablet (10 mg total) by mouth daily.   30 tablet   0   . Multiple Vitamin (MULTIVITAMIN WITH MINERALS)  TABS   Oral   Take 1 tablet by mouth every morning.           BP 109/82  Pulse 69  Temp(Src) 98.3 F (36.8 C) (Oral)  Resp 16  SpO2 97% Physical Exam  Nursing note and vitals reviewed. Constitutional: She is oriented to person, place, and time. She appears well-developed and well-nourished. No distress.  HENT:  Head: Normocephalic and atraumatic.  Small bite mark to left lateral tongue without significant laceration.  No bleeding.  Teeth at baseline with no acute injury.  Eyes: Conjunctivae and EOM are normal. Pupils are equal, round, and reactive to light.  Neck: Normal range of motion. Neck supple.  No cervical midline tenderness.  Cardiovascular: Normal rate and regular rhythm.   No murmur heard. Pulmonary/Chest: Effort normal and breath sounds normal. No respiratory distress. She has no wheezes. She has no rales.  Abdominal: Soft. There is no tenderness. There is no  rebound and no guarding.  Musculoskeletal:  Small abrasion over right posterior elbow.  No deformity or swelling.  Full ROM.  Normal distal pulses, grip strength and sensations in fingers.  Left ankle with ace wrap in place.  TTP over the medial malleolus.  No deformity.  No significant swelling.  Normal dorsal pedal pulses.  Normal sensations and cap refill in toes.   Neurological: She is alert and oriented to person, place, and time. No cranial nerve deficit or sensory deficit.  Skin: Skin is warm and dry. No rash noted.  Psychiatric: She has a normal mood and affect. Her behavior is normal.    ED Course   Procedures   Results for orders placed during the hospital encounter of 10/20/12  POCT I-STAT, CHEM 8      Result Value Range   Sodium 138  135 - 145 mEq/L   Potassium 3.6  3.5 - 5.1 mEq/L   Chloride 105  96 - 112 mEq/L   BUN 6  6 - 23 mg/dL   Creatinine, Ser 4.09  0.50 - 1.10 mg/dL   Glucose, Bld 811 (*) 70 - 99 mg/dL   Calcium, Ion 9.14  7.82 - 1.23 mmol/L   TCO2 20  0 - 100 mmol/L   Hemoglobin 14.3  12.0 - 15.0 g/dL   HCT 95.6  21.3 - 08.6 %      1. Seizure   2. Benzodiazepine withdrawal       MDM  3:40PM Pt seen and evaluated.  Pt appears well. Awake and alert.  Small abrasion to right elbow.  Pt requesting to leave. She does not wish to give urine sample. Pt was seen and evaluated for similar episode 3 months ago.  Symptoms most likely due to benzodiazepine withdrawal given she has not had any xanax today.  Pt given Neurology referral.  Pt instructed not to drive until cleared by her doctors or neurologist.   Angus Seller, PA-C 10/20/12 1711

## 2013-06-24 ENCOUNTER — Emergency Department (HOSPITAL_COMMUNITY): Payer: Self-pay

## 2013-06-24 ENCOUNTER — Emergency Department (HOSPITAL_COMMUNITY)
Admission: EM | Admit: 2013-06-24 | Discharge: 2013-06-24 | Disposition: A | Discharge status: Home / Self Care | Payer: Self-pay | Attending: Emergency Medicine | Admitting: Emergency Medicine

## 2013-06-24 ENCOUNTER — Encounter (HOSPITAL_COMMUNITY): Payer: Self-pay | Admitting: Emergency Medicine

## 2013-06-24 DIAGNOSIS — S93409A Sprain of unspecified ligament of unspecified ankle, initial encounter: Secondary | ICD-10-CM | POA: Insufficient documentation

## 2013-06-24 DIAGNOSIS — S93402A Sprain of unspecified ligament of left ankle, initial encounter: Secondary | ICD-10-CM

## 2013-06-24 DIAGNOSIS — Y9289 Other specified places as the place of occurrence of the external cause: Secondary | ICD-10-CM | POA: Insufficient documentation

## 2013-06-24 DIAGNOSIS — G8929 Other chronic pain: Secondary | ICD-10-CM | POA: Insufficient documentation

## 2013-06-24 DIAGNOSIS — F411 Generalized anxiety disorder: Secondary | ICD-10-CM | POA: Insufficient documentation

## 2013-06-24 DIAGNOSIS — X500XXA Overexertion from strenuous movement or load, initial encounter: Secondary | ICD-10-CM | POA: Insufficient documentation

## 2013-06-24 DIAGNOSIS — Z79899 Other long term (current) drug therapy: Secondary | ICD-10-CM | POA: Insufficient documentation

## 2013-06-24 DIAGNOSIS — Y9389 Activity, other specified: Secondary | ICD-10-CM | POA: Insufficient documentation

## 2013-06-24 DIAGNOSIS — G40909 Epilepsy, unspecified, not intractable, without status epilepticus: Secondary | ICD-10-CM | POA: Insufficient documentation

## 2013-06-24 DIAGNOSIS — F17200 Nicotine dependence, unspecified, uncomplicated: Secondary | ICD-10-CM | POA: Insufficient documentation

## 2013-06-24 MED ORDER — IBUPROFEN 800 MG PO TABS: 800.0000 mg | ORAL_TABLET | Freq: Three times a day (TID) | ORAL | Status: DC

## 2013-06-24 MED ORDER — OXYCODONE HCL 5 MG PO CAPS: 5.0000 mg | ORAL_CAPSULE | Freq: Four times a day (QID) | ORAL | Status: DC | PRN

## 2013-06-24 MED ORDER — OXYCODONE HCL 5 MG PO TABS
5.0000 mg | ORAL_TABLET | Freq: Once | ORAL | Status: AC
  Administered 2013-06-24: 5 mg via ORAL
  Filled 2013-06-24: qty 1

## 2013-06-24 NOTE — ED Notes (Signed)
The pt injured her lt ankle 4 years ago and for the past year she has had more pain.  She has twisted  It several times

## 2013-06-24 NOTE — Progress Notes (Signed)
Orthopedic Tech Progress Note Patient Details:  Colleen Evans March 21, 1986 [id number]  Ortho Devices Type of Ortho Device: ASO   Haskell Flirtewsome, [name] M 06/24/2013, 1:58 AM

## 2013-06-24 NOTE — ED Provider Notes (Signed)
Medical screening examination/treatment/procedure(s) were performed by non-physician practitioner and as supervising physician I was immediately available for consultation/collaboration.     Esiquio Boesen, MD 06/24/13 0441 

## 2013-06-24 NOTE — Discharge Instructions (Signed)
Acute Ankle Sprain  with Phase I Rehab  An acute ankle sprain is a partial or complete tear in one or more of the ligaments of the ankle due to traumatic injury. The severity of the injury depends on both the the number of ligaments sprained and the grade of sprain. There are 3 grades of sprains.   · A grade 1 sprain is a mild sprain. There is a slight pull without obvious tearing. There is no loss of strength, and the muscle and ligament are the correct length.  · A grade 2 sprain is a moderate sprain. There is tearing of fibers within the substance of the ligament where it connects two bones or two cartilages. The length of the ligament is increased, and there is usually decreased strength.  · A grade 3 sprain is a complete rupture of the ligament and is uncommon.  In addition to the grade of sprain, there are three types of ankle sprains.   Lateral ankle sprains: This is a sprain of one or more of the three ligaments on the outer side (lateral) of the ankle. These are the most common sprains.  Medial ankle sprains: There is one large triangular ligament of the inner side (medial) of the ankle that is susceptible to injury. Medial ankle sprains are less common.  Syndesmosis, "high ankle," sprains: The syndesmosis is the ligament that connects the two bones of the lower leg. Syndesmosis sprains usually only occur with very severe ankle sprains.  SYMPTOMS  · Pain, tenderness, and swelling in the ankle, starting at the side of injury that may progress to the whole ankle and foot with time.  · "Pop" or tearing sensation at the time of injury.  · Bruising that may spread to the heel.  · Impaired ability to walk soon after injury.  CAUSES   · Acute ankle sprains are caused by trauma placed on the ankle that temporarily forces or pries the anklebone (talus) out of its normal socket.  · Stretching or tearing of the ligaments that normally hold the joint in place (usually due to a twisting injury).  RISK INCREASES  WITH:  · Previous ankle sprain.  · Sports in which the foot may land awkwardly (ie. basketball, volleyball, or soccer) or walking or running on uneven or rough surfaces.  · Shoes with inadequate support to prevent sideways motion when stress occurs.  · Poor strength and flexibility.  · Poor balance skills.  · Contact sports.  PREVENTION   · Warm up and stretch properly before activity.  · Maintain physical fitness:  · Ankle and leg flexibility, muscle strength, and endurance.  · Cardiovascular fitness.  · Balance training activities.  · Use proper technique and have a coach correct improper technique.  · Taping, protective strapping, bracing, or high-top tennis shoes may help prevent injury. Initially, tape is best; however, it loses most of its support function within 10 to 15 minutes.  · Wear proper fitted protective shoes (High-top shoes with taping or bracing is more effective than either alone).  · Provide the ankle with support during sports and practice activities for 12 months following injury.  PROGNOSIS   · If treated properly, ankle sprains can be expected to recover completely; however, the length of recovery depends on the degree of injury.  · A grade 1 sprain usually heals enough in 5 to 7 days to allow modified activity and requires an average of 6 weeks to heal completely.  · A grade 2 sprain requires   6 to 10 weeks to heal completely.  · A grade 3 sprain requires 12 to 16 weeks to heal.  · A syndesmosis sprain often takes more than 3 months to heal.  RELATED COMPLICATIONS   · Frequent recurrence of symptoms may result in a chronic problem. Appropriately addressing the problem the first time decreases the frequency of recurrence and optimizes healing time. Severity of the initial sprain does not predict the likelihood of later instability.  · Injury to other structures (bone, cartilage, or tendon).  · A chronically unstable or arthritic ankle joint is a possiblity with repeated  sprains.  TREATMENT  Treatment initially involves the use of ice, medication, and compression bandages to help reduce pain and inflammation. Ankle sprains are usually immobilized in a walking cast or boot to allow for healing. Crutches may be recommended to reduce pressure on the injury. After immobilization, strengthening and stretching exercises may be necessary to regain strength and a full range of motion. Surgery is rarely needed to treat ankle sprains.  MEDICATION   · Nonsteroidal anti-inflammatory medications, such as aspirin and ibuprofen (do not take for the first 3 days after injury or within 7 days before surgery), or other minor pain relievers, such as acetaminophen, are often recommended. Take these as directed by your caregiver. Contact your caregiver immediately if any bleeding, stomach upset, or signs of an allergic reaction occur from these medications.  · Ointments applied to the skin may be helpful.  · Pain relievers may be prescribed as necessary by your caregiver. Do not take prescription pain medication for longer than 4 to 7 days. Use only as directed and only as much as you need.  HEAT AND COLD  · Cold treatment (icing) is used to relieve pain and reduce inflammation for acute and chronic cases. Cold should be applied for 10 to 15 minutes every 2 to 3 hours for inflammation and pain and immediately after any activity that aggravates your symptoms. Use ice packs or an ice massage.  · Heat treatment may be used before performing stretching and strengthening activities prescribed by your caregiver. Use a heat pack or a warm soak.  SEEK IMMEDIATE MEDICAL CARE IF:   · Pain, swelling, or bruising worsens despite treatment.  · You experience pain, numbness, discoloration, or coldness in the foot or toes.  · New, unexplained symptoms develop (drugs used in treatment may produce side effects.)  EXERCISES   PHASE I EXERCISES  RANGE OF MOTION (ROM) AND STRETCHING EXERCISES - Ankle Sprain, Acute Phase I,  Weeks 1 to 2  These exercises may help you when beginning to restore flexibility in your ankle. You will likely work on these exercises for the 1 to 2 weeks after your injury. Once your physician, physical therapist, or athletic trainer sees adequate progress, he or she will advance your exercises. While completing these exercises, remember:   · Restoring tissue flexibility helps normal motion to return to the joints. This allows healthier, less painful movement and activity.  · An effective stretch should be held for at least 30 seconds.  · A stretch should never be painful. You should only feel a gentle lengthening or release in the stretched tissue.  RANGE OF MOTION - Dorsi/Plantar Flexion  · While sitting with your right / left knee straight, draw the top of your foot upwards by flexing your ankle. Then reverse the motion, pointing your toes downward.  · Hold each position for __________ seconds.  · After completing your first set of   exercises, repeat this exercise with your knee bent.  Repeat __________ times. Complete this exercise __________ times per day.   RANGE OF MOTION - Ankle Alphabet  · Imagine your right / left big toe is a pen.  · Keeping your hip and knee still, write out the entire alphabet with your "pen." Make the letters as large as you can without increasing any discomfort.  Repeat __________ times. Complete this exercise __________ times per day.   STRENGTHENING EXERCISES - Ankle Sprain, Acute -Phase I, Weeks 1 to 2  These exercises may help you when beginning to restore strength in your ankle. You will likely work on these exercises for 1 to 2 weeks after your injury. Once your physician, physical therapist, or athletic trainer sees adequate progress, he or she will advance your exercises. While completing these exercises, remember:   · Muscles can gain both the endurance and the strength needed for everyday activities through controlled exercises.  · Complete these exercises as instructed by  your physician, physical therapist, or athletic trainer. Progress the resistance and repetitions only as guided.  · You may experience muscle soreness or fatigue, but the pain or discomfort you are trying to eliminate should never worsen during these exercises. If this pain does worsen, stop and make certain you are following the directions exactly. If the pain is still present after adjustments, discontinue the exercise until you can discuss the trouble with your clinician.  STRENGTH - Dorsiflexors  · Secure a rubber exercise band/tubing to a fixed object (ie. table, pole) and loop the other end around your right / left foot.  · Sit on the floor facing the fixed object. The band/tubing should be slightly tense when your foot is relaxed.  · Slowly draw your foot back toward you using your ankle and toes.  · Hold this position for __________ seconds. Slowly release the tension in the band and return your foot to the starting position.  Repeat __________ times. Complete this exercise __________ times per day.   STRENGTH - Plantar-flexors   · Sit with your right / left leg extended. Holding onto both ends of a rubber exercise band/tubing, loop it around the ball of your foot. Keep a slight tension in the band.  · Slowly push your toes away from you, pointing them downward.  · Hold this position for __________ seconds. Return slowly, controlling the tension in the band/tubing.  Repeat __________ times. Complete this exercise __________ times per day.   STRENGTH - Ankle Eversion  · Secure one end of a rubber exercise band/tubing to a fixed object (table, pole). Loop the other end around your foot just before your toes.  · Place your fists between your knees. This will focus your strengthening at your ankle.  · Drawing the band/tubing across your opposite foot, slowly, pull your little toe out and up. Make sure the band/tubing is positioned to resist the entire motion.  · Hold this position for __________ seconds.  Have  your muscles resist the band/tubing as it slowly pulls your foot back to the starting position.   Repeat __________ times. Complete this exercise __________ times per day.   STRENGTH - Ankle Inversion  · Secure one end of a rubber exercise band/tubing to a fixed object (table, pole). Loop the other end around your foot just before your toes.  · Place your fists between your knees. This will focus your strengthening at your ankle.  · Slowly, pull your big toe up and in, making   sure the band/tubing is positioned to resist the entire motion.  · Hold this position for __________ seconds.  · Have your muscles resist the band/tubing as it slowly pulls your foot back to the starting position.  Repeat __________ times. Complete this exercises __________ times per day.   STRENGTH - Towel Curls  · Sit in a chair positioned on a non-carpeted surface.  · Place your right / left foot on a towel, keeping your heel on the floor.  · Pull the towel toward your heel by only curling your toes. Keep your heel on the floor.  · If instructed by your physician, physical therapist, or athletic trainer, add weight to the end of the towel.  Repeat __________ times. Complete this exercise __________ times per day.  Document Released: 10/10/2004 Document Revised: 06/03/2011 Document Reviewed: 06/23/2008  [employer]® Patient Information ©2014 [employer].

## 2013-06-24 NOTE — ED Provider Notes (Signed)
CSN: [id number]     Arrival date & time 06/24/13  0026 History   First MD Initiated Contact with Patient 06/24/13 0057     Chief Complaint  Patient presents with  . Ankle Pain     (Consider location/radiation/quality/duration/timing/severity/associated sxs/prior Treatment) HPI  28 year old female with history of chronic back pain, anxiety, seizure who presents complaining of left ankle pain. Patient states she injured her left ankle 4 years ago. At that time the doctor mentioned that she will probably need surgery otherwise she will have long-lasting effects. She refuses surgery because she had a newborn that she needs to care for and she is a single mother. Patient works in [employer] and wear high heels for work. For the past month she has had increasing pain to the left ankle which radiates to her leg. She also mentioned that she twisted several times, most recent was last week. For the past 2-3 days she is having increasing pain. For the past 2 days she is unable to wear high heels secondary to pain. Pain is sharp, throbbing, severe, worsening with movement. She does not have any pain medication for it but she has been using Ace wrap, ice, elevation with minimal relief. No complaints of numbness weakness.  Past Medical History  Diagnosis Date  . Back pain, chronic   . Anxiety   . Seizures    Past Surgical History  Procedure Laterality Date  . Back surgery    . Cosmetic surgery    . Breast surgery     Family History  Problem Relation Age of Onset  . Diabetes Maternal Grandmother   . Kidney disease Maternal Grandmother    History  Substance Use Topics  . Smoking status: Current Some Day Smoker  . Smokeless tobacco: Not on file  . Alcohol Use: No   OB History   Grav Para Term Preterm Abortions TAB SAB Ect Mult Living                 Review of Systems  Constitutional: Negative for fever.  Musculoskeletal: Positive for arthralgias.  Skin: Negative for rash  and wound.  Neurological: Negative for numbness.      Allergies  Acetaminophen  Home Medications   Current Outpatient Rx  Name  Route  Sig  Dispense  Refill  . ALPRAZolam (XANAX) 1 MG tablet   Oral   Take 1 mg by mouth 3 (three) times daily as needed for anxiety.          . ALPRAZolam (XANAX) 1 MG tablet   Oral   Take 1 tablet (1 mg total) by mouth at bedtime as needed for sleep or anxiety.   6 tablet   0   . amphetamine-dextroamphetamine (ADDERALL) 20 MG tablet   Oral   Take 20 mg by mouth daily as needed (for attention).          . cetirizine (ZYRTEC) 10 MG tablet   Oral   Take 10 mg by mouth daily as needed for allergies.         . Multiple Vitamin (MULTIVITAMIN WITH MINERALS) TABS   Oral   Take 1 tablet by mouth every morning.           BP 113/82  Pulse 87  Temp(Src) 98.2 F (36.8 C) (Oral)  Resp 20  Ht 5\' 3"  (1.6 m)  Wt 137 lb 2 oz (62.199 kg)  BMI 24.30 kg/m2  SpO2 100% Physical Exam  Nursing note and vitals reviewed. Constitutional:  She appears well-developed and well-nourished. No distress.  HENT:  Head: Atraumatic.  Eyes: Conjunctivae are normal.  Neck: Neck supple.  Musculoskeletal:       Left hip: Normal.       Left knee: Normal.       Left ankle: She exhibits decreased range of motion. She exhibits no swelling, no ecchymosis, no deformity, no laceration and normal pulse. Tenderness. Lateral malleolus and medial malleolus tenderness found. No AITFL, no CF ligament, no posterior TFL, no head of 5th metatarsal and no proximal fibula tenderness found. Achilles tendon normal.  Neurological: She is alert.  Skin: No rash noted.  Psychiatric: She has a normal mood and affect.    ED Course  Procedures (including critical care time)  1:31 AM Pt having acute on chronic L ankle pain, and report twisting it several times within the past month.  Xray neg for acute fx or dislocation.  Pain likely due to sprain.  Pt is NVI.  Will provide ASO, short  course of pain meds, and referral to ortho for further care.  RICE therapy discussed.    Labs Review Labs Reviewed - No data to display Imaging Review Dg Ankle Complete Left  06/24/2013   CLINICAL DATA:  Twisting injury 1 week ago with persistent pain  EXAM: LEFT ANKLE COMPLETE - 3+ VIEW  COMPARISON:  Prior radiographs of the left ankle 09/14/2010  FINDINGS: There is no evidence of fracture, dislocation, or joint effusion. There is no evidence of arthropathy or other focal bone abnormality. Soft tissues are unremarkable. Incidental note is made of an os trigonum.  IMPRESSION: Negative.   Electronically Signed   By: Malachy Moan M.D.   On: 06/24/2013 01:07     EKG Interpretation None      MDM   Final diagnoses:  Left ankle sprain    BP 113/82  Pulse 87  Temp(Src) 98.2 F (36.8 C) (Oral)  Resp 20  Ht 5\' 3"  (1.6 m)  Wt 137 lb 2 oz (62.199 kg)  BMI 24.30 kg/m2  SpO2 100%  I have reviewed nursing notes and vital signs. I personally reviewed the imaging tests through PACS system  I reviewed available ER/hospitalization records thought the EMR     Fayrene Helper, [place] 06/24/13 0133

## 2013-07-01 ENCOUNTER — Telehealth (HOSPITAL_BASED_OUTPATIENT_CLINIC_OR_DEPARTMENT_OTHER): Payer: Self-pay | Admitting: *Deleted

## 2013-07-01 NOTE — Telephone Encounter (Signed)
Wants work note for 1 week off from work,  Was offered a note by [employer] for 1 week off per her statement but thought she didn't need it,  Now is saying she needs one.  I told her to come back and be seen again to get a note saying she can return to work.

## 2013-07-04 ENCOUNTER — Telehealth (HOSPITAL_BASED_OUTPATIENT_CLINIC_OR_DEPARTMENT_OTHER): Payer: Self-pay | Admitting: *Deleted

## 2013-07-04 NOTE — Telephone Encounter (Signed)
Pt here in department wanting work note.  I told her I can only give her a note to be out for 3 days.

## 2014-01-27 ENCOUNTER — Emergency Department (HOSPITAL_COMMUNITY)
Admission: EM | Admit: 2014-01-27 | Discharge: 2014-01-27 | Disposition: A | Discharge status: Home / Self Care | Payer: Self-pay | Attending: Emergency Medicine | Admitting: Emergency Medicine

## 2014-01-27 ENCOUNTER — Emergency Department (HOSPITAL_COMMUNITY)
Admission: EM | Admit: 2014-01-27 | Discharge: 2014-01-27 | Disposition: A | Discharge status: Home / Self Care | Payer: Self-pay

## 2014-01-27 ENCOUNTER — Encounter (HOSPITAL_COMMUNITY): Payer: Self-pay | Admitting: *Deleted

## 2014-01-27 DIAGNOSIS — Z886 Allergy status to analgesic agent status: Secondary | ICD-10-CM | POA: Insufficient documentation

## 2014-01-27 DIAGNOSIS — J029 Acute pharyngitis, unspecified: Secondary | ICD-10-CM | POA: Insufficient documentation

## 2014-01-27 DIAGNOSIS — Z79891 Long term (current) use of opiate analgesic: Secondary | ICD-10-CM | POA: Insufficient documentation

## 2014-01-27 DIAGNOSIS — G8929 Other chronic pain: Secondary | ICD-10-CM | POA: Insufficient documentation

## 2014-01-27 DIAGNOSIS — F419 Anxiety disorder, unspecified: Secondary | ICD-10-CM | POA: Insufficient documentation

## 2014-01-27 DIAGNOSIS — R569 Unspecified convulsions: Secondary | ICD-10-CM | POA: Insufficient documentation

## 2014-01-27 DIAGNOSIS — Z720 Tobacco use: Secondary | ICD-10-CM | POA: Insufficient documentation

## 2014-01-27 DIAGNOSIS — M549 Dorsalgia, unspecified: Secondary | ICD-10-CM | POA: Insufficient documentation

## 2014-01-27 LAB — RAPID STREP SCREEN (MED CTR MEBANE ONLY): Streptococcus, Group A Screen (Direct): NEGATIVE

## 2014-01-27 MED ORDER — IBUPROFEN 600 MG PO TABS: 600.0000 mg | ORAL_TABLET | Freq: Four times a day (QID) | ORAL | Status: DC | PRN

## 2014-01-27 MED ORDER — OXYCODONE-ACETAMINOPHEN 5-325 MG PO TABS: 1.0000 | ORAL_TABLET | Freq: Four times a day (QID) | ORAL | Status: DC | PRN

## 2014-01-27 MED ORDER — OXYCODONE-ACETAMINOPHEN 5-325 MG PO TABS
1.0000 | ORAL_TABLET | Freq: Once | ORAL | Status: AC
  Administered 2014-01-27: 1 via ORAL
  Filled 2014-01-27: qty 1

## 2014-01-27 MED ORDER — LIDOCAINE VISCOUS 2 % MT SOLN: 20.0000 mL | OROMUCOSAL | Status: DC | PRN

## 2014-01-27 NOTE — ED Provider Notes (Signed)
CSN: [id number]     Arrival date & time 01/27/14  0120 History   First MD Initiated Contact with Patient 01/27/14 0406     Chief Complaint  Patient presents with  . Sore Throat     (Consider location/radiation/quality/duration/timing/severity/associated sxs/prior Treatment) HPI Comments: Colleen Evans is a 28 y.o. female who complains of sore throat for 4 days. She denies a history of chills, fevers, myalgias, sweats and wheezing and denies a history of asthma. Patient has pain with swallowing, and reports that she has a cough - with green phlegm that started today.   Patient is a 28 y.o. female presenting with pharyngitis. The history is provided by the patient.  Sore Throat Pertinent negatives include no chest pain.    Past Medical History  Diagnosis Date  . Back pain, chronic   . Anxiety   . Seizures    Past Surgical History  Procedure Laterality Date  . Back surgery    . Cosmetic surgery    . Breast surgery     Family History  Problem Relation Age of Onset  . Diabetes Maternal Grandmother   . Kidney disease Maternal Grandmother    History  Substance Use Topics  . Smoking status: Current Some Day Smoker  . Smokeless tobacco: Not on file  . Alcohol Use: No   OB History    No data available     Review of Systems  Constitutional: Positive for chills and activity change. Negative for fever.  HENT: Positive for sore throat and trouble swallowing. Negative for dental problem, drooling, ear discharge, ear pain, postnasal drip, rhinorrhea and voice change.   Respiratory: Positive for cough.   Cardiovascular: Negative for chest pain.  Skin: Negative for rash.      Allergies  Acetaminophen  Home Medications   Prior to Admission medications   Medication Sig Start Date End Date Taking? Authorizing Provider  ALPRAZolam Prudy Feeler(XANAX) 1 MG tablet Take 1 mg by mouth 3 (three) times daily as needed for anxiety.     Historical Provider, MD  amphetamine-dextroamphetamine  (ADDERALL) 20 MG tablet Take 20 mg by mouth daily as needed (for attention).     Historical Provider, MD  cetirizine (ZYRTEC) 10 MG tablet Take 10 mg by mouth daily as needed for allergies.    Historical Provider, MD  ibuprofen (ADVIL,MOTRIN) 600 MG tablet Take 1 tablet (600 mg total) by mouth every 6 (six) hours as needed. 01/27/14   Derwood KaplanAnkit Mackenzye Mackel, MD  lidocaine (XYLOCAINE) 2 % solution Use as directed 20 mLs in the mouth or throat as needed for mouth pain. 01/27/14   Derwood KaplanAnkit Elodia Haviland, MD  oxycodone (OXY-IR) 5 MG capsule Take 1 capsule (5 mg total) by mouth every 6 (six) hours as needed for pain. 06/24/13   Fayrene HelperBowie Tran, PA-C  oxyCODONE-acetaminophen (PERCOCET/ROXICET) 5-325 MG per tablet Take 1 tablet by mouth every 6 (six) hours as needed for severe pain. 01/27/14   Rashaunda Rahl Rhunette CroftNanavati, MD   BP 120/94 mmHg  Pulse 94  Temp(Src) 97.8 F (36.6 C) (Oral)  Resp 16  Ht 5\' 5"  (1.651 m)  Wt 135 lb (61.236 kg)  BMI 22.47 kg/m2  SpO2 99%  LMP 01/27/2014 Physical Exam  Constitutional: She appears well-developed.  HENT:  Mouth/Throat: Oropharyngeal exudate present.  Neck:  Neg trismus  Cardiovascular: Normal rate.   Pulmonary/Chest: Effort normal and breath sounds normal. She has no wheezes.  Abdominal: She exhibits no mass.  Lymphadenopathy:    She has cervical adenopathy.  Nursing note and  vitals reviewed.   ED Course  Procedures (including critical care time) Labs Review Labs Reviewed  RAPID STREP SCREEN  CULTURE, GROUP A STREP    Imaging Review No results found.   EKG Interpretation None      MDM   Final diagnoses:  Pharyngitis    Pt comes in with sore throat. Rapid strep is neg. + exudate, + adenopathy, but has cough and no fevers. Will culture the swab. Pt requesting strong pain meds, as she is on them chronically. 6 percocets prescribed.   Derwood KaplanAnkit Jerine Surles, MD 01/27/14 [phone]

## 2014-01-27 NOTE — ED Notes (Signed)
Pt decided to [facility].

## 2014-01-27 NOTE — Discharge Instructions (Signed)

## 2014-01-27 NOTE — ED Notes (Signed)
The pt has had  A sorethroat earache and body aches since Saturday.  Unknown temp.  lmp now

## 2014-01-27 NOTE — ED Notes (Signed)
Patient is alert and orientedx4.  Patient was explained discharge instructions and they understood them with no questions.  The patient's friend, Colleen Evans is taking the patient home.

## 2014-01-31 LAB — CULTURE, GROUP A STREP

## 2014-02-01 ENCOUNTER — Telehealth (HOSPITAL_BASED_OUTPATIENT_CLINIC_OR_DEPARTMENT_OTHER): Payer: Self-pay | Admitting: Emergency Medicine

## 2014-02-01 NOTE — Telephone Encounter (Signed)
Post ED Visit - Positive Culture Follow-up: Successful Patient Follow-Up  Culture assessed and recommendations reviewed by: []  Wes Dulaney, Pharm.D., BCPS [x]  Celedonio MiyamotoJeremy Frens, Pharm.D., BCPS []  Georgina PillionElizabeth Martin, [address]Ph[address]D., BCPS []  MitchellMinh Pham, [address]Ph[address]D., BCPS, AAHIVP []  Estella HuskMichelle Turner, Pharm.D., BCPS, AAHIVP []  Red ChristiansSamson Lee, Pharm.D. []  Tennis Mustassie Stewart, Pharm.D.  Positive strep culture  [x]  Patient discharged without antimicrobial prescription and treatment is now indicated []  Organism is resistant to prescribed ED discharge antimicrobial []  Patient with positive blood cultures  Changes discussed with ED provider: [facility] The[facility]als[facility] New antibiotic prescription  Amoxicillin 500mg  bid x 10 days  Left voicemail with emergency contact for callback   Berle MullMiller, [place] 02/01/2014, 10:53 AM

## 2014-02-01 NOTE — Progress Notes (Cosign Needed)
ED Antimicrobial Stewardship Positive Culture Follow Up   Colleen Evans is an 28 y.o. female who [facility] with a chief complaint of  Chief Complaint  Patient presents with  . Sore Throat    Recent Results (from the past 720 hour(s))  Rapid strep screen     Status: None   Collection Time: 01/27/14  1:40 AM  Result Value Ref Range Status   Streptococcus, Group A Screen (Direct) NEGATIVE NEGATIVE Final    Comment: (NOTE) A Rapid Antigen test may result negative if the antigen level in the sample is below the detection level of this test. The FDA has not cleared this test as a stand-alone test therefore the rapid antigen negative result has reflexed to a Group A Strep culture.   Culture, Group A Strep     Status: None   Collection Time: 01/27/14  1:40 AM  Result Value Ref Range Status   Specimen Description THROAT  Final   Special Requests CX ADDED AT 0159 ON [id number]  Final   Culture   Final    GROUP A STREP (S.PYOGENES) ISOLATED [facility]    Report Status 01/31/2014 FINAL  Final    [x]  Patient discharged originally without antimicrobial agent and treatment is now indicated  New antibiotic prescription: Amoxicillin 500 mg po BID for 10 days  ED Provider: Emilia BeckKaitlyn Szekalski, PA-C   Derwood KaplanShah, Marq Rebello D, PharmD Candiate  Celedonio MiyamotoJeremy Frens 02/01/2014, 9:57 AM Infectious Diseases Pharmacist Phone# [phone]

## 2014-02-03 ENCOUNTER — Telehealth (HOSPITAL_BASED_OUTPATIENT_CLINIC_OR_DEPARTMENT_OTHER): Payer: Self-pay

## 2014-02-03 NOTE — ED Notes (Signed)
Unable to reach by telephone. Letter sent to address on record.  

## 2014-02-04 ENCOUNTER — Telehealth (HOSPITAL_BASED_OUTPATIENT_CLINIC_OR_DEPARTMENT_OTHER): Payer: Self-pay | Admitting: Emergency Medicine

## 2014-02-04 NOTE — Telephone Encounter (Signed)
Post ED Visit - Positive Culture Follow-up: Successful Patient Follow-Up  Culture assessed and recommendations reviewed by: []  Wes Dulaney, Pharm.D., BCPS []  Celedonio MiyamotoJeremy Frens, Pharm.D., BCPS []  Georgina PillionElizabeth Martin, Pharm.D., BCPS []  Pine LawnMinh Pham, [place]arm[place]D., BCPS, AAHIVP []  Estella HuskMichelle Turner, Pharm.D., BCPS, AAHIVP []  Red ChristiansSamson Lee, Pharm.D. []  Tennis Mustassie Stewart, [place]arm[place]D.  Positive strep culture  [x]  Patient discharged without antimicrobial prescription and treatment is now indicated []  Organism is resistant to prescribed ED discharge antimicrobial []  Patient with positive blood cultures  Changes discussed with ED provider: szekalski PA New antibiotic prescription Amoxicillin 500mg  po bid x 10 days [facility]  Contacted patient, date 02/04/14, time 1558   Berle MullMiller, [place] Tagliaferro 02/04/2014, 3:55 PM

## 2014-08-08 ENCOUNTER — Encounter (HOSPITAL_COMMUNITY): Payer: Self-pay

## 2014-08-08 ENCOUNTER — Emergency Department (HOSPITAL_COMMUNITY)
Admission: EM | Admit: 2014-08-08 | Discharge: 2014-08-09 | Disposition: A | Discharge status: Home / Self Care | Payer: Self-pay | Attending: Emergency Medicine | Admitting: Emergency Medicine

## 2014-08-08 DIAGNOSIS — Z3202 Encounter for pregnancy test, result negative: Secondary | ICD-10-CM | POA: Insufficient documentation

## 2014-08-08 DIAGNOSIS — N831 Corpus luteum cyst of ovary, unspecified side: Secondary | ICD-10-CM

## 2014-08-08 DIAGNOSIS — Z8669 Personal history of other diseases of the nervous system and sense organs: Secondary | ICD-10-CM | POA: Insufficient documentation

## 2014-08-08 DIAGNOSIS — F419 Anxiety disorder, unspecified: Secondary | ICD-10-CM | POA: Insufficient documentation

## 2014-08-08 DIAGNOSIS — Z720 Tobacco use: Secondary | ICD-10-CM | POA: Insufficient documentation

## 2014-08-08 DIAGNOSIS — M549 Dorsalgia, unspecified: Secondary | ICD-10-CM | POA: Insufficient documentation

## 2014-08-08 DIAGNOSIS — Z79899 Other long term (current) drug therapy: Secondary | ICD-10-CM | POA: Insufficient documentation

## 2014-08-08 DIAGNOSIS — G8929 Other chronic pain: Secondary | ICD-10-CM | POA: Insufficient documentation

## 2014-08-08 DIAGNOSIS — R1084 Generalized abdominal pain: Secondary | ICD-10-CM

## 2014-08-08 LAB — CBC WITH DIFFERENTIAL/PLATELET
BASOS ABS: 0 10*3/uL (ref 0.0–0.1)
Basophils Relative: 0 % (ref 0–1)
Eosinophils Absolute: 0.5 10*3/uL (ref 0.0–0.7)
Eosinophils Relative: 7 % — ABNORMAL HIGH (ref 0–5)
HCT: 41.6 % (ref 36.0–46.0)
HEMOGLOBIN: 13.9 g/dL (ref 12.0–15.0)
LYMPHS ABS: 2.6 10*3/uL (ref 0.7–4.0)
LYMPHS PCT: 37 % (ref 12–46)
MCH: 33.6 pg (ref 26.0–34.0)
MCHC: 33.4 g/dL (ref 30.0–36.0)
MCV: 100.5 fL — ABNORMAL HIGH (ref 78.0–100.0)
MONO ABS: 0.4 10*3/uL (ref 0.1–1.0)
MONOS PCT: 6 % (ref 3–12)
NEUTROS ABS: 3.5 10*3/uL (ref 1.7–7.7)
Neutrophils Relative %: 50 % (ref 43–77)
Platelets: 259 10*3/uL (ref 150–400)
RBC: 4.14 MIL/uL (ref 3.87–5.11)
RDW: 12.6 % (ref 11.5–15.5)
WBC: 7 10*3/uL (ref 4.0–10.5)

## 2014-08-08 LAB — POC URINE PREG, ED: Preg Test, Ur: NEGATIVE

## 2014-08-08 NOTE — ED Notes (Signed)
Pt presents with c/o right lower abdominal pain that radiates to her back. Pt denies any injury. Pt reports the pain initially started on the right lower side of her abdomen and moved to her back and reports it is swollen as well. Tearful in triage.

## 2014-08-09 ENCOUNTER — Encounter (HOSPITAL_COMMUNITY): Payer: Self-pay

## 2014-08-09 ENCOUNTER — Emergency Department (HOSPITAL_COMMUNITY): Payer: Self-pay

## 2014-08-09 LAB — LIPASE, BLOOD: Lipase: 17 U/L — ABNORMAL LOW (ref 22–51)

## 2014-08-09 LAB — COMPREHENSIVE METABOLIC PANEL
ALT: 10 U/L — AB (ref 14–54)
ANION GAP: 8 (ref 5–15)
AST: 14 U/L — ABNORMAL LOW (ref 15–41)
Albumin: 3.7 g/dL (ref 3.5–5.0)
Alkaline Phosphatase: 43 U/L (ref 38–126)
BUN: 7 mg/dL (ref 6–20)
CALCIUM: 8.6 mg/dL — AB (ref 8.9–10.3)
CO2: 25 mmol/L (ref 22–32)
Chloride: 109 mmol/L (ref 101–111)
Creatinine, Ser: 0.68 mg/dL (ref 0.44–1.00)
GLUCOSE: 89 mg/dL (ref 65–99)
Potassium: 4.6 mmol/L (ref 3.5–5.1)
SODIUM: 142 mmol/L (ref 135–145)
TOTAL PROTEIN: 6.1 g/dL — AB (ref 6.5–8.1)
Total Bilirubin: 0.2 mg/dL — ABNORMAL LOW (ref 0.3–1.2)

## 2014-08-09 LAB — URINALYSIS, ROUTINE W REFLEX MICROSCOPIC
Bilirubin Urine: NEGATIVE
GLUCOSE, UA: NEGATIVE mg/dL
Hgb urine dipstick: NEGATIVE
Ketones, ur: NEGATIVE mg/dL
LEUKOCYTES UA: NEGATIVE
Nitrite: NEGATIVE
PH: 6.5 (ref 5.0–8.0)
PROTEIN: NEGATIVE mg/dL
Specific Gravity, Urine: 1.021 (ref 1.005–1.030)
Urobilinogen, UA: 1 mg/dL (ref 0.0–1.0)

## 2014-08-09 MED ORDER — FENTANYL CITRATE (PF) 100 MCG/2ML IJ SOLN
50.0000 ug | Freq: Once | INTRAMUSCULAR | Status: AC
  Administered 2014-08-09: 50 ug via INTRAVENOUS
  Filled 2014-08-09: qty 2

## 2014-08-09 MED ORDER — IOHEXOL 300 MG/ML  SOLN
25.0000 mL | Freq: Once | INTRAMUSCULAR | Status: AC | PRN
  Administered 2014-08-09: 25 mL via ORAL

## 2014-08-09 MED ORDER — DICYCLOMINE HCL 20 MG PO TABS: 20.0000 mg | ORAL_TABLET | Freq: Four times a day (QID) | ORAL | Status: DC | PRN

## 2014-08-09 MED ORDER — ONDANSETRON HCL 4 MG/2ML IJ SOLN
4.0000 mg | Freq: Once | INTRAMUSCULAR | Status: AC
Start: 2014-08-09 — End: 2014-08-09
  Administered 2014-08-09: 4 mg via INTRAVENOUS
  Filled 2014-08-09: qty 2

## 2014-08-09 MED ORDER — IOHEXOL 300 MG/ML  SOLN
100.0000 mL | Freq: Once | INTRAMUSCULAR | Status: AC | PRN
  Administered 2014-08-09: 100 mL via INTRAVENOUS

## 2014-08-09 NOTE — Discharge Instructions (Signed)
Abdominal Pain °Many things can cause abdominal pain. Usually, abdominal pain is not caused by a disease and will improve without treatment. It can often be observed and treated at home. Your health care provider will do a physical exam and possibly order blood tests and X-rays to help determine the seriousness of your pain. However, in many cases, more time must pass before a clear cause of the pain can be found. Before that point, your health care provider may not know if you need more testing or further treatment. °HOME CARE INSTRUCTIONS  °Monitor your abdominal pain for any changes. The following actions may help to alleviate any discomfort you are experiencing: °· Only take over-the-counter or prescription medicines as directed by your health care provider. °· Do not take laxatives unless directed to do so by your health care provider. °· Try a clear liquid diet (broth, tea, or water) as directed by your health care provider. Slowly move to a bland diet as tolerated. °SEEK MEDICAL CARE IF: °· You have unexplained abdominal pain. °· You have abdominal pain associated with nausea or diarrhea. °· You have pain when you urinate or have a bowel movement. °· You experience abdominal pain that wakes you in the night. °· You have abdominal pain that is worsened or improved by eating food. °· You have abdominal pain that is worsened with eating fatty foods. °· You have a fever. °SEEK IMMEDIATE MEDICAL CARE IF:  °· Your pain does not go away within 2 hours. °· You keep throwing up (vomiting). °· Your pain is felt only in portions of the abdomen, such as the right side or the left lower portion of the abdomen. °· You pass bloody or black tarry stools. °MAKE SURE YOU: °· Understand these instructions.   °· Will watch your condition.   °· Will get help right away if you are not doing well or get worse.   °Document Released: 12/19/2004 Document Revised: 03/16/2013 Document Reviewed: 11/18/2012 °ExitCare® Patient Information  ©2015 [employer]. This information is not intended to replace advice given to you by your health care provider. Make sure you discuss any questions you have with your health care provider. ° °Ovarian Cyst °An ovarian cyst is a sac filled with fluid or blood. This sac is attached to the ovary. Some cysts go away on their own. Other cysts need treatment.  °HOME CARE  °· Only take medicine as told by your doctor. °· Follow up with your doctor as told. °· Get regular pelvic exams and Pap tests. °GET HELP IF: °· Your periods are late, not regular, or painful. °· You stop having periods. °· Your belly (abdominal) or pelvic pain does not go away. °· Your belly becomes large or puffy (swollen). °· You have a hard time peeing (totally emptying your bladder). °· You have pressure on your bladder. °· You have pain during sex. °· You feel fullness, pressure, or discomfort in your belly. °· You lose weight for no reason. °· You feel sick most of the time. °· You have a hard time pooping (constipation). °· You do not feel like eating. °· You develop pimples (acne). °· You have an increase in hair on your body and face. °· You are gaining weight for no reason. °· You think you are pregnant. °GET HELP RIGHT AWAY IF:  °· Your belly pain gets worse. °· You feel sick to your stomach (nauseous), and you throw up (vomit). °· You have a fever that comes on fast. °· You have belly   pain while pooping (bowel movement). °· Your periods are heavier than usual. °MAKE SURE YOU:  °· Understand these instructions. °· Will watch your condition. °· Will get help right away if you are not doing well or get worse. °Document Released: 08/28/2007 Document Revised: 12/30/2012 Document Reviewed: 11/16/2012 °ExitCare® Patient Information ©2015 [employer]. This information is not intended to replace advice given to you by your health care provider. Make sure you discuss any questions you have with your health care provider. ° °

## 2014-08-09 NOTE — ED Notes (Signed)
Patient states right lower abd pain started 2 days ago, all of a sudden. Pt states the pain radiates to her back but has now progressed to her left lower back. Denies fever, nausea, vomiting, vaginal discharge, or urinary symptoms.

## 2014-08-09 NOTE — ED Provider Notes (Signed)
CSN: [id number]     Arrival date & time 08/08/14  2136 History   First MD Initiated Contact with Patient 08/09/14 0012     Chief Complaint  Patient presents with  . Abdominal Pain  . Back Pain     (Consider location/radiation/quality/duration/timing/severity/associated sxs/prior Treatment) HPI 29 year old female presents to emergency department with complaint of diffuse abdominal pain since Friday, 5 days.  Pain has been worsening in nature.  It started on the right side and radiates downward across the now over into her left abdomen.  She is unsure if it is worse after eating.  She has had no fever, chills, nausea, vomiting, diarrhea.  She reports normal bowel movement yesterday.  She has had her last menstrual cycle.  A week ago.  She denies any urinary symptoms.  She took oxycodone at home, which she has for chronic back pain, which did not help her pain.  No prior abdominal surgeries.  No history of stomach or intestine issues Past Medical History  Diagnosis Date  . Back pain, chronic   . Anxiety   . Seizures    Past Surgical History  Procedure Laterality Date  . Back surgery    . Cosmetic surgery    . Breast surgery     Family History  Problem Relation Age of Onset  . Diabetes Maternal Grandmother   . Kidney disease Maternal Grandmother    History  Substance Use Topics  . Smoking status: Current Some Day Smoker  . Smokeless tobacco: Not on file  . Alcohol Use: No   OB History    No data available     Review of Systems   See History of Present Illness; otherwise all other systems are reviewed and negative  Allergies  Acetaminophen  Home Medications   Prior to Admission medications   Medication Sig Start Date End Date Taking? Authorizing Provider  ALPRAZolam Prudy Feeler(XANAX) 1 MG tablet Take 1 mg by mouth 3 (three) times daily as needed for anxiety.    Yes Historical Provider, MD  amphetamine-dextroamphetamine (ADDERALL) 20 MG tablet Take 20 mg by mouth daily as needed  (for attention).    Yes Historical Provider, MD  ibuprofen (ADVIL,MOTRIN) 600 MG tablet Take 1 tablet (600 mg total) by mouth every 6 (six) hours as needed. 01/27/14  Yes Derwood KaplanAnkit Nanavati, MD  oxycodone (OXY-IR) 5 MG capsule Take 1 capsule (5 mg total) by mouth every 6 (six) hours as needed for pain. 06/24/13  Yes Fayrene HelperBowie Tran, PA-C  Oxycodone HCl 20 MG TABS Take 1 tablet by mouth 3 (three) times daily. 07/18/14  Yes Historical Provider, MD  oxyCODONE-acetaminophen (PERCOCET/ROXICET) 5-325 MG per tablet Take 1 tablet by mouth every 6 (six) hours as needed for severe pain. 01/27/14  Yes Derwood KaplanAnkit Nanavati, MD  lidocaine (XYLOCAINE) 2 % solution Use as directed 20 mLs in the mouth or throat as needed for mouth pain. Patient not taking: Reported on 08/09/2014 01/27/14   Derwood KaplanAnkit Nanavati, MD   BP 124/97 mmHg  Pulse 80  Temp(Src) 98.4 F (36.9 C) (Oral)  Resp 20  SpO2 100%  LMP 08/01/2014 (Approximate) Physical Exam  Constitutional: She is oriented to person, place, and time. She appears well-developed and well-nourished.  HENT:  Head: Normocephalic and atraumatic.  Nose: Nose normal.  Mouth/Throat: Oropharynx is clear and moist.  Eyes: Conjunctivae and EOM are normal. Pupils are equal, round, and reactive to light.  Neck: Normal range of motion. Neck supple. No JVD present. No tracheal deviation present. No thyromegaly present.  Cardiovascular: Normal rate, regular rhythm, normal heart sounds and intact distal pulses.  Exam reveals no gallop and no friction rub.   No murmur heard. Pulmonary/Chest: Effort normal and breath sounds normal. No stridor. No respiratory distress. She has no wheezes. She has no rales. She exhibits no tenderness.  Abdominal: Soft. She exhibits no distension and no mass. There is tenderness. There is no rebound and no guarding.  Diffuse abdominal pain.  Worse in right upper quadrant and left lower quadrant.  Hyperactive bowel sounds  Musculoskeletal: Normal range of motion. She  exhibits no edema or tenderness.  Lymphadenopathy:    She has no cervical adenopathy.  Neurological: She is alert and oriented to person, place, and time. She displays normal reflexes. She exhibits normal muscle tone. Coordination normal.  Skin: Skin is warm and dry. No rash noted. No erythema. No pallor.  Psychiatric: She has a normal mood and affect. Her behavior is normal. Judgment and thought content normal.  Nursing note and vitals reviewed.   ED Course  Procedures (including critical care time) Labs Review Labs Reviewed  CBC WITH DIFFERENTIAL/PLATELET - Abnormal; Notable for the following:    MCV 100.5 (*)    Eosinophils Relative 7 (*)    All other components within normal limits  COMPREHENSIVE METABOLIC PANEL - Abnormal; Notable for the following:    Calcium 8.6 (*)    Total Protein 6.1 (*)    AST 14 (*)    ALT 10 (*)    Total Bilirubin 0.2 (*)    All other components within normal limits  LIPASE, BLOOD - Abnormal; Notable for the following:    Lipase 17 (*)    All other components within normal limits  URINALYSIS, ROUTINE W REFLEX MICROSCOPIC - Abnormal; Notable for the following:    APPearance CLOUDY (*)    All other components within normal limits  POC URINE PREG, ED    Imaging Review Ct Abdomen Pelvis W Contrast  08/09/2014   CLINICAL DATA:  Abdominal pain, back pain. Diffuse pain for 4 days, more so on the right.  EXAM: CT ABDOMEN AND PELVIS WITH CONTRAST  TECHNIQUE: Multidetector CT imaging of the abdomen and pelvis was performed using the standard protocol following bolus administration of intravenous contrast.  CONTRAST:  100mL OMNIPAQUE IOHEXOL 300 MG/ML  SOLN  COMPARISON:  None.  FINDINGS: Minimal hypoventilatory change at the lung bases. No consolidation or pleural effusion.  The liver, gallbladder, spleen, pancreas, and adrenal glands are normal. No biliary dilatation. Kidneys are symmetric in size with symmetric enhancement, no focal renal abnormality.  Stomach  is decompressed. There are no dilated or thickened bowel loops. Moderate stool throughout the colon. The appendix is not confidently identified, no inflammatory change to suggest appendicitis in the right lower quadrant.  Abdominal aorta is normal in caliber. No retroperitoneal adenopathy. Small central mesenteric lymph nodes without enlarged or pathologic adenopathy.  Within the pelvis the uterus is normal for age. There is a 2 cm peripherally enhancing cyst in the right adnexa that may be a corpus luteum. The left ovary is normal. Small amount of free fluid in the pelvis, likely physiologic. Urinary bladder is minimally distended, question of bladder wall thickening.  There are no acute or suspicious osseous abnormalities.  IMPRESSION: 1. Probable corpus luteal cyst in the right ovary, this may be the cause of patient's right-sided pain. 2. Question bladder wall thickening, correlation with urinalysis recommended to exclude urinary tract infection.   Electronically Signed   By: Shawna OrleansMelanie  Ehinger M.D.   On: 08/09/2014 03:27     EKG Interpretation None      MDM   Final diagnoses:  Generalized abdominal pain  Corpus luteum cyst   29 year old female with worsening abdominal pain over the last 4-5 days.  Patient is on chronic narcotics.  Plan for CT abdomen pelvis for further evaluation.    Marisa Severin, MD 08/09/14 [phone]

## 2018-02-11 ENCOUNTER — Emergency Department (HOSPITAL_COMMUNITY)
Admission: EM | Admit: 2018-02-11 | Discharge: 2018-02-11 | Disposition: A | Discharge status: Home / Self Care | Payer: Medicaid Other | Attending: Emergency Medicine | Admitting: Emergency Medicine

## 2018-02-11 ENCOUNTER — Encounter (HOSPITAL_COMMUNITY): Payer: Self-pay

## 2018-02-11 ENCOUNTER — Other Ambulatory Visit: Payer: Self-pay

## 2018-02-11 ENCOUNTER — Emergency Department (HOSPITAL_COMMUNITY): Payer: Medicaid Other

## 2018-02-11 DIAGNOSIS — Z3A01 Less than 8 weeks gestation of pregnancy: Secondary | ICD-10-CM | POA: Insufficient documentation

## 2018-02-11 DIAGNOSIS — F17200 Nicotine dependence, unspecified, uncomplicated: Secondary | ICD-10-CM | POA: Insufficient documentation

## 2018-02-11 DIAGNOSIS — O209 Hemorrhage in early pregnancy, unspecified: Secondary | ICD-10-CM | POA: Diagnosis present

## 2018-02-11 DIAGNOSIS — O200 Threatened abortion: Secondary | ICD-10-CM

## 2018-02-11 DIAGNOSIS — Z79899 Other long term (current) drug therapy: Secondary | ICD-10-CM | POA: Insufficient documentation

## 2018-02-11 LAB — URINALYSIS, ROUTINE W REFLEX MICROSCOPIC
Bilirubin Urine: NEGATIVE
Glucose, UA: NEGATIVE mg/dL
KETONES UR: 5 mg/dL — AB
Leukocytes, UA: NEGATIVE
NITRITE: NEGATIVE
Protein, ur: NEGATIVE mg/dL
SPECIFIC GRAVITY, URINE: 1.01 (ref 1.005–1.030)
pH: 6 (ref 5.0–8.0)

## 2018-02-11 LAB — ABO/RH: ABO/RH(D): O POS

## 2018-02-11 LAB — CBC
HEMATOCRIT: 37.9 % (ref 36.0–46.0)
Hemoglobin: 12.7 g/dL (ref 12.0–15.0)
MCH: 31.6 pg (ref 26.0–34.0)
MCHC: 33.5 g/dL (ref 30.0–36.0)
MCV: 94.3 fL (ref 80.0–100.0)
Platelets: 282 10*3/uL (ref 150–400)
RBC: 4.02 MIL/uL (ref 3.87–5.11)
RDW: 12.1 % (ref 11.5–15.5)
WBC: 7.6 10*3/uL (ref 4.0–10.5)
nRBC: 0 % (ref 0.0–0.2)

## 2018-02-11 LAB — POC URINE PREG, ED: Preg Test, Ur: POSITIVE — AB

## 2018-02-11 LAB — TYPE AND SCREEN
ABO/RH(D): O POS
Antibody Screen: NEGATIVE

## 2018-02-11 LAB — HCG, QUANTITATIVE, PREGNANCY: hCG, Beta Chain, Quant, S: 15394 m[IU]/mL — ABNORMAL HIGH (ref <5)

## 2018-02-11 MED ORDER — ONDANSETRON HCL 4 MG/2ML IJ SOLN
4.0000 mg | Freq: Once | INTRAMUSCULAR | Status: DC
  Filled 2018-02-11: qty 2

## 2018-02-11 NOTE — ED Notes (Signed)
Ultrasound at bedside

## 2018-02-11 NOTE — ED Triage Notes (Signed)
Pt reports that her first OB appointment is tomorrow. She states that she has been experiencing some light spotting up until tonight when she started experiencing heavy blood like a period. She estimates that she is 11 weeks patient. Denies pain. A&Ox4.

## 2018-02-11 NOTE — Discharge Instructions (Addendum)
You were seen today for bleeding during pregnancy.  Your ultrasound shows a gestational sac but no fetal heartbeat.  You need a repeat ultrasound and blood pregnancy levels in the next 1 to 2 weeks.  Follow-up with women's outpatient clinic.  If you develop worsening bleeding, bleeding through more than 1 pad per hour, dizziness, abdominal pain, you should be reevaluated immediately.  [facility]

## 2018-02-11 NOTE — ED Provider Notes (Signed)
[facility]-EMERGENCY DEPT Provider Note   CSN: [id number] Arrival date & time: 02/11/18  0021     History   Chief Complaint Chief Complaint  Patient presents with  . Vaginal Bleeding    HPI Colleen Evans is a 32 y.o. female.  HPI  This is a 32 year old G50, P57 female currently pregnant with a positive pregnancy test 2 weeks ago who presents with vaginal bleeding.  Patient reports her last period was sometime in mid September.  She believes she is approximately [redacted] weeks pregnant.  She has not had an ultrasound and has not seen an OB yet.  Tonight when she went to the bathroom she noted vaginal bleeding.  She did not note clots but states "there was a lot of blood."  She denies dizziness.  She reports some nausea.  No significant abdominal pain or cramping.  No lateralizing pain.  She reports one prior miscarriage.  Denies dysuria hematuria  Past Medical History:  Diagnosis Date  . Anxiety   . Back pain, chronic   . Seizures (HCC)     There are no active problems to display for this patient.   Past Surgical History:  Procedure Laterality Date  . BACK SURGERY    . BREAST SURGERY    . COSMETIC SURGERY       OB History   None      Home Medications    Prior to Admission medications   Medication Sig Start Date End Date Taking? Authorizing Provider  acetaminophen (TYLENOL) 325 MG tablet Take 650 mg by mouth every 6 (six) hours as needed for moderate pain.   Yes [provider]  ALPRAZolam Prudy Feeler) 1 MG tablet Take 1 mg by mouth 3 (three) times daily as needed for anxiety.    Yes [provider]  citalopram (CELEXA) 40 MG tablet Take 20 mg by mouth daily.  12/29/17  Yes [provider]  prenatal vitamin w/FE, FA (PRENATAL 1 + 1) 27-1 MG TABS tablet Take 1 tablet by mouth daily at 12 noon.   Yes [provider]  dicyclomine (BENTYL) 20 MG tablet Take 1 tablet (20 mg total) by mouth every 6 (six) hours as needed  for spasms (for abdominal cramping). Patient not taking: Reported on 02/11/2018 08/09/14   Marisa Severin, MD  ibuprofen (ADVIL,MOTRIN) 600 MG tablet Take 1 tablet (600 mg total) by mouth every 6 (six) hours as needed. Patient not taking: Reported on 02/11/2018 01/27/14   Derwood Kaplan, MD  lidocaine (XYLOCAINE) 2 % solution Use as directed 20 mLs in the mouth or throat as needed for mouth pain. Patient not taking: Reported on 08/09/2014 01/27/14   Derwood Kaplan, MD  oxycodone (OXY-IR) 5 MG capsule Take 1 capsule (5 mg total) by mouth every 6 (six) hours as needed for pain. Patient not taking: Reported on 02/11/2018 06/24/13   Fayrene Helper, PA-C  oxyCODONE-acetaminophen (PERCOCET/ROXICET) 5-325 MG per tablet Take 1 tablet by mouth every 6 (six) hours as needed for severe pain. Patient not taking: Reported on 02/11/2018 01/27/14   Derwood Kaplan, MD    Family History Family History  Problem Relation Age of Onset  . Diabetes Maternal Grandmother   . Kidney disease Maternal Grandmother     Social History Social History   Tobacco Use  . Smoking status: Current Some Day Smoker  Substance Use Topics  . Alcohol use: No  . Drug use: No     Allergies   Acetaminophen   Review of  Systems Review of Systems  Constitutional: Negative for fever.  Gastrointestinal: Positive for nausea. Negative for abdominal pain and vomiting.  Genitourinary: Positive for vaginal bleeding. Negative for dysuria.  All other systems reviewed and are negative.    Physical Exam Updated Vital Signs BP 117/87   Pulse 69   Temp 98.8 F (37.1 C) (Oral)   Resp 14   Ht 1.651 m (5\' 5" )   Wt 63 kg   SpO2 98%   BMI 23.13 kg/m   Physical Exam  Constitutional: She is oriented to person, place, and time. She appears well-developed and well-nourished.  Tearful but nontoxic  HENT:  Head: Normocephalic and atraumatic.  Neck: Neck supple.  Cardiovascular: Normal rate, regular rhythm and normal heart sounds.    Pulmonary/Chest: Effort normal. No respiratory distress. She has no wheezes.  Abdominal: Soft. Bowel sounds are normal. There is no tenderness.  Genitourinary:  Genitourinary Comments: Normal external vaginal exam, scant dark blood noted in the vaginal vault, cervical os is closed  Neurological: She is alert and oriented to person, place, and time.  Skin: Skin is warm and dry.  Psychiatric: She has a normal mood and affect.  Nursing note and vitals reviewed.    ED Treatments / Results  Labs (all labs ordered are listed, but only abnormal results are displayed) Labs Reviewed  URINALYSIS, ROUTINE W REFLEX MICROSCOPIC - Abnormal; Notable for the following components:      Result Value   Hgb urine dipstick LARGE (*)    Ketones, ur 5 (*)    Bacteria, UA FEW (*)    All other components within normal limits  HCG, QUANTITATIVE, PREGNANCY - Abnormal; Notable for the following components:   hCG, Beta Chain, Quant, S 15,394 (*)    All other components within normal limits  POC URINE PREG, ED - Abnormal; Notable for the following components:   Preg Test, Ur POSITIVE (*)    All other components within normal limits  CBC  TYPE AND SCREEN    EKG None  Radiology [place] Ob Comp Less 14 Wks  Result Date: 02/11/2018 CLINICAL DATA:  Vaginal bleeding. Estimated gestational age by LMP is 9 weeks 3 days. No quantitative beta HCG available. EXAM: OBSTETRIC <14 WK US AND TRANSVAGINAL OB US TECHNIQUE: Both transabdominal and transvaginal ultrasound examinations were performed for complete evaluation of the gestation as well as the maternal uterus, adnexal regions, and pelvic cul-de-sac. Transvaginal technique was performed to assess early pregnancy. COMPARISON:  None. FINDINGS: Intrauterine gestational sac: A single intrauterine gestational sac is identified. There is material within the gestational sac which is not well defined. This may represent a fetal pole and yolk sac. Yolk sac:  Not definitely  identified. Embryo:  Not definitely identified. Cardiac Activity: Not identified. MSD: 17.1 mm   6 w   4 d Subchorionic hemorrhage:  None visualized. Maternal uterus/adnexae: Uterus is mildly anteverted. No myometrial mass lesions. Both ovaries are visualized. Presumed corpus luteal cyst on the right. No abnormal adnexal masses. No free fluid in the pelvis. IMPRESSION: Probable early intrauterine gestational sac, but no yolk sac, fetal pole, or cardiac activity yet visualized. Recommend follow-up quantitative B-HCG levels and follow-up US in 14 days to assess viability. This recommendation follows SRU consensus guidelines: Diagnostic Criteria for Nonviable Pregnancy Early in the First Trimester. Malva Limes Engl J Med 2013; 956:2130-86; 369:1443-51. Electronically Signed   By: Burman NievesWilliam  Stevens M.D.   On: 02/11/2018 03:26   [place] Ob Transvaginal  Result Date: 02/11/2018 CLINICAL DATA:  Vaginal  bleeding. Estimated gestational age by LMP is 9 weeks 3 days. No quantitative beta HCG available. EXAM: OBSTETRIC <14 WK [place] AND TRANSVAGINAL OB US TECHNIQUE: Both transabdominal and transvaginal ultrasound examinations were performed for complete evaluation of the gestation as well as the maternal uterus, adnexal regions, and pelvic cul-de-sac. Transvaginal technique was performed to assess early pregnancy. COMPARISON:  None. FINDINGS: Intrauterine gestational sac: A single intrauterine gestational sac is identified. There is material within the gestational sac which is not well defined. This may represent a fetal pole and yolk sac. Yolk sac:  Not definitely identified. Embryo:  Not definitely identified. Cardiac Activity: Not identified. MSD: 17.1 mm   6 w   4 d Subchorionic hemorrhage:  None visualized. Maternal uterus/adnexae: Uterus is mildly anteverted. No myometrial mass lesions. Both ovaries are visualized. Presumed corpus luteal cyst on the right. No abnormal adnexal masses. No free fluid in the pelvis. IMPRESSION: Probable early  intrauterine gestational sac, but no yolk sac, fetal pole, or cardiac activity yet visualized. Recommend follow-up quantitative B-HCG levels and follow-up US in 14 days to assess viability. This recommendation follows SRU consensus guidelines: Diagnostic Criteria for Nonviable Pregnancy Early in the First Trimester. Malva Limes Med 2013; 629:5284-13. Electronically Signed   By: Burman Nieves M.D.   On: 02/11/2018 03:26    Procedures Procedures (including critical care time)  Medications Ordered in ED Medications  ondansetron (ZOFRAN) injection 4 mg (4 mg Intravenous Refused 02/11/18 0347)     Initial Impression / Assessment and Plan / ED Course  I have reviewed the triage vital signs and the nursing notes.  Pertinent labs & imaging results that were available during my care of the patient were reviewed by me and considered in my medical decision making (see chart for details).     Patient presents with bleeding the first trimester.  She is overall nontoxic-appearing.  She is tearful appropriately.  She has no significant abdominal tenderness.  No lateralizing symptoms.  Labs obtained.  Hemoglobin is stable.  Beta-hCG is greater than 15,000.  Type and screen is pending; however, ABO testing from 2009 reviewed and patient is a positive.  No indication for RhoGam.  Currently cervical office is closed.  Ultrasound indicates a 6-week 4-day pregnancy with a gestational sac.  No yolk sac or fetal heart beat noted.  This is not consistent with last period.  Patient reports that her periods are somewhat irregular.  She [facility].  She needs OB follow-up with repeat ultrasound and beta-hCG levels.  I discussed with her that this could represent a threatened miscarriage.  It also could be an early intrauterine pregnancy.  Unclear at this time.  She was given bleeding precautions.  After history, exam, and medical workup I feel the patient has been appropriately  medically screened and is safe for discharge home. Pertinent diagnoses were discussed with the patient. Patient was given return precautions.   Final Clinical Impressions(s) / ED Diagnoses   Final diagnoses:  Threatened miscarriage    ED Discharge Orders    None       Ayva Veilleux, Mayer Masker, MD 02/11/18 [phone]

## 2018-02-12 ENCOUNTER — Encounter (HOSPITAL_COMMUNITY): Payer: Self-pay

## 2018-02-12 ENCOUNTER — Inpatient Hospital Stay (HOSPITAL_COMMUNITY): Payer: Medicaid Other

## 2018-02-12 ENCOUNTER — Inpatient Hospital Stay (HOSPITAL_COMMUNITY)
Admission: AD | Admit: 2018-02-12 | Discharge: 2018-02-12 | Disposition: A | Discharge status: Home / Self Care | Payer: Medicaid Other | Source: Ambulatory Visit | Attending: Obstetrics & Gynecology | Admitting: Obstetrics & Gynecology

## 2018-02-12 DIAGNOSIS — Z3A01 Less than 8 weeks gestation of pregnancy: Secondary | ICD-10-CM | POA: Diagnosis not present

## 2018-02-12 DIAGNOSIS — O99331 Smoking (tobacco) complicating pregnancy, first trimester: Secondary | ICD-10-CM | POA: Diagnosis not present

## 2018-02-12 DIAGNOSIS — O209 Hemorrhage in early pregnancy, unspecified: Secondary | ICD-10-CM

## 2018-02-12 DIAGNOSIS — O039 Complete or unspecified spontaneous abortion without complication: Secondary | ICD-10-CM | POA: Insufficient documentation

## 2018-02-12 LAB — CBC
HCT: 36.1 % (ref 36.0–46.0)
Hemoglobin: 12.4 g/dL (ref 12.0–15.0)
MCH: 32.3 pg (ref 26.0–34.0)
MCHC: 34.3 g/dL (ref 30.0–36.0)
MCV: 94 fL (ref 80.0–100.0)
Platelets: 285 10*3/uL (ref 150–400)
RBC: 3.84 MIL/uL — ABNORMAL LOW (ref 3.87–5.11)
RDW: 12.5 % (ref 11.5–15.5)
WBC: 9.3 10*3/uL (ref 4.0–10.5)
nRBC: 0 % (ref 0.0–0.2)

## 2018-02-12 LAB — TYPE AND SCREEN
ABO/RH(D): O POS
Antibody Screen: NEGATIVE

## 2018-02-12 LAB — HCG, QUANTITATIVE, PREGNANCY: hCG, Beta Chain, Quant, S: 7354 m[IU]/mL — ABNORMAL HIGH (ref <5)

## 2018-02-12 MED ORDER — HYDROMORPHONE HCL 1 MG/ML IJ SOLN
1.0000 mg | Freq: Once | INTRAMUSCULAR | Status: AC
  Administered 2018-02-12: 1 mg via INTRAVENOUS
  Filled 2018-02-12: qty 1

## 2018-02-12 MED ORDER — LACTATED RINGERS IV BOLUS
1000.0000 mL | Freq: Once | INTRAVENOUS | Status: AC
  Administered 2018-02-12: 1000 mL via INTRAVENOUS

## 2018-02-12 MED ORDER — IBUPROFEN 600 MG PO TABS: 600.0000 mg | ORAL_TABLET | Freq: Four times a day (QID) | ORAL | 0 refills | Status: AC | PRN

## 2018-02-12 NOTE — MAU Note (Signed)
Pt started having abd pain and heavy vaginal bleeding about 1-2 hours ago. Passed tissue /clot in BR during triage

## 2018-02-12 NOTE — MAU Provider Note (Signed)
Chief Complaint: Vaginal Bleeding   First Provider Initiated Contact with Patient 02/12/18 1553     SUBJECTIVE HPI: Colleen Evans is a 32 y.o. G3P1011 at [redacted]w[redacted]d who presents to Maternity Admissions reporting vaginal bleeding and abdominal pain. Was seen last night at [facility] for threatened miscarriage (had u/s that showed IUGS w/internal debris). This afternoon pain and bleeding increased. Reports heavy bleeding with large clots for the last hour prior to arrival and feels like she passed the pregnancy when she went to the MAU bathroom after being triaged. Bleeding has slowed down since arrival. Pain has somewhat improved since passage of large clot.   Location: lower abdomen Quality: cramping Severity: 6/10 on pain scale Duration: 2 hours Timing: intermittend Modifying factors: improved after passing clots Associated signs and symptoms: vaginal bleeding  Past Medical History:  Diagnosis Date  . Anxiety   . Back pain, chronic   . Seizures (HCC)    OB History  Gravida Para Term Preterm AB Living  3 1 1   1 1   SAB TAB Ectopic Multiple Live Births  1       1    # Outcome Date GA Lbr Len/2nd Weight Sex Delivery Anes PTL Lv  3 Current           2 Term           1 SAB            Past Surgical History:  Procedure Laterality Date  . BREAST SURGERY    . COSMETIC SURGERY     Social History   Socioeconomic History  . Marital status: Single    Spouse name: Not on file  . Number of children: Not on file  . Years of education: Not on file  . Highest education level: Not on file  Occupational History  . Not on file  Social Needs  . Financial resource strain: Not on file  . Food insecurity:    Worry: Not on file    Inability: Not on file  . Transportation needs:    Medical: Not on file    Non-medical: Not on file  Tobacco Use  . Smoking status: Current Some Day Smoker  . Smokeless tobacco: Never Used  Substance and Sexual Activity  . Alcohol use: No  . Drug use: No  .  Sexual activity: Yes    Birth control/protection: None  Lifestyle  . Physical activity:    Days per week: Not on file    Minutes per session: Not on file  . Stress: Not on file  Relationships  . Social connections:    Talks on phone: Not on file    Gets together: Not on file    Attends religious service: Not on file    Active member of club or organization: Not on file    Attends meetings of clubs or organizations: Not on file    Relationship status: Not on file  . Intimate partner violence:    Fear of current or ex partner: Not on file    Emotionally abused: Not on file    Physically abused: Not on file    Forced sexual activity: Not on file  Other Topics Concern  . Not on file  Social History Narrative  . Not on file   Family History  Problem Relation Age of Onset  . Diabetes Maternal Grandmother   . Kidney disease Maternal Grandmother    No current facility-administered medications on file prior to encounter.  Current Outpatient Medications on File Prior to Encounter  Medication Sig Dispense Refill  . acetaminophen (TYLENOL) 325 MG tablet Take 650 mg by mouth every 6 (six) hours as needed for moderate pain.    Marland Kitchen. ALPRAZolam (XANAX) 1 MG tablet Take 1 mg by mouth 3 (three) times daily as needed for anxiety.     . citalopram (CELEXA) 40 MG tablet Take 20 mg by mouth daily.   12  . prenatal vitamin w/FE, FA (PRENATAL 1 + 1) 27-1 MG TABS tablet Take 1 tablet by mouth daily at 12 noon.     Allergies  Allergen Reactions  . Acetaminophen Other (See Comments)    "kidney and liver problems run in my family"    I have reviewed patient's Past Medical Hx, Surgical Hx, Family Hx, Social Hx, medications and allergies.   Review of Systems  Constitutional: Negative.   Gastrointestinal: Positive for abdominal pain.  Genitourinary: Positive for vaginal bleeding.    OBJECTIVE Patient Vitals for the past 24 hrs:  BP Temp Pulse Resp  02/12/18 1846 112/76 - 91 18  02/12/18 1647  118/82 - 100 -  02/12/18 1529 120/89 99.7 F (37.6 C) (!) 133 18   Constitutional: Well-developed, well-nourished female in no acute distress.  Cardiovascular: normal rate & rhythm, no murmur Respiratory: normal rate and effort. Lung sounds clear throughout GI: Abd soft, non-tender, Pos BS x 4. No guarding or rebound tenderness MS: Extremities nontender, no edema, normal ROM Neurologic: Alert and oriented x 4.  GU:  Small amount of dark red blood. Cervix closed. No CMT. Abdomen non tender   LAB RESULTS Results for orders placed or performed during the hospital encounter of 02/12/18 (from the past 24 hour(s))  CBC     Status: Abnormal   Collection Time: 02/12/18  4:34 PM  Result Value Ref Range   WBC 9.3 4.0 - 10.5 K/uL   RBC 3.84 (L) 3.87 - 5.11 MIL/uL   Hemoglobin 12.4 12.0 - 15.0 g/dL   HCT 16.136.1 09.636.0 - 04.546.0 %   MCV 94.0 80.0 - 100.0 fL   MCH 32.3 26.0 - 34.0 pg   MCHC 34.3 30.0 - 36.0 g/dL   RDW 40.912.5 81.111.5 - 91.415.5 %   Platelets 285 150 - 400 K/uL   nRBC 0.0 0.0 - 0.2 %  Type and screen     Status: None   Collection Time: 02/12/18  4:34 PM  Result Value Ref Range   ABO/RH(D) O POS    Antibody Screen NEG    Sample Expiration      02/15/2018 Performed at [facility], [address] [place], [place] [id number]   hCG, quantitative, pregnancy     Status: Abnormal   Collection Time: 02/12/18  4:34 PM  Result Value Ref Range   hCG, Beta Chain, Quant, S 7,354 (H) <5 mIU/mL    IMAGING [place] Ob Transvaginal  Result Date: 02/12/2018 CLINICAL DATA:  Pregnant patient with vaginal bleeding. EXAM: TRANSVAGINAL OB ULTRASOUND TECHNIQUE: Transvaginal ultrasound was performed for complete evaluation of the gestation as well as the maternal uterus, adnexal regions, and pelvic cul-de-sac. COMPARISON:  Pelvic ultrasound 02/11/2018 FINDINGS: Intrauterine gestational sac: None Yolk sac:  Not Visualized. Embryo:  Not Visualized. Cardiac Activity: Not Visualized. Maternal uterus/adnexae: No  intrauterine gestational sac identified. The endometrium is thickened and heterogeneous measuring up to 24 mm. Normal right and left ovaries. No free fluid in the pelvis. IMPRESSION: No intrauterine gestation identified. Suspected intrauterine gestational sac identified on prior ultrasound is  no longer present, potentially secondary to interval spontaneous abortion. Thickened heterogeneous endometrium measuring up to 24 mm may represent blood products. Retained products of conception not excluded. Recommend continued clinical follow-up and potential short-term follow-up pelvic ultrasound to assess for interval improvement/resolution. Electronically Signed   By: Annia Belt M.D.   On: 02/12/2018 17:43    MAU COURSE Orders Placed This Encounter  Procedures  . US OB Transvaginal  . CBC  . hCG, quantitative, pregnancy  . Type and screen  . Discharge patient   Meds ordered this encounter  Medications  . lactated ringers bolus 1,000 mL  . HYDROmorphone (DILAUDID) injection 1 mg  . ibuprofen (ADVIL,MOTRIN) 600 MG tablet    Sig: Take 1 tablet (600 mg total) by mouth every 6 (six) hours as needed for up to 7 days.    Dispense:  28 tablet    Refill:  0    Order Specific Question:   Supervising Provider    Answer:   Jaynie Collins A [[id number]]    MDM Pt initially tachycardic. Given IV fluids. Vitals improve. Hemoglobin stable.   HCG dropped & ultrasound no longer shows IUGS -- consistent with completed miscarriage  Pain improved after IV dilaudid ASSESSMENT 1. Miscarriage   2. Vaginal bleeding in pregnancy, first trimester     PLAN Discharge home in stable condition. Bleeding & infection precautions Pelvic rest Msg to [facility] for f/u appts  Follow-up Information    [facility] Follow up.   Specialty:  Obstetrics and Gynecology Why:  office will call you to schedule follow up appointments Contact information: [address] [place]  [phone]         Allergies as of 02/12/2018      Reactions   Acetaminophen Other (See Comments)   "kidney and liver problems run in my family"      Medication List    STOP taking these medications   dicyclomine 20 MG tablet Commonly known as:  BENTYL   lidocaine 2 % solution Commonly known as:  XYLOCAINE   oxycodone 5 MG capsule Commonly known as:  OXY-IR   oxyCODONE-acetaminophen 5-325 MG tablet Commonly known as:  PERCOCET/ROXICET     TAKE these medications   acetaminophen 325 MG tablet Commonly known as:  TYLENOL Take 650 mg by mouth every 6 (six) hours as needed for moderate pain.   ALPRAZolam 1 MG tablet Commonly known as:  XANAX Take 1 mg by mouth 3 (three) times daily as needed for anxiety.   citalopram 40 MG tablet Commonly known as:  CELEXA Take 20 mg by mouth daily.   ibuprofen 600 MG tablet Commonly known as:  ADVIL,MOTRIN Take 1 tablet (600 mg total) by mouth every 6 (six) hours as needed for up to 7 days.   prenatal vitamin w/FE, FA 27-1 MG Tabs tablet Take 1 tablet by mouth daily at 12 noon.        Judeth Horn, NP 02/12/2018  10:06 PM

## 2018-02-12 NOTE — Discharge Instructions (Signed)

## 2018-02-13 ENCOUNTER — Telehealth: Payer: Self-pay | Admitting: Family Medicine

## 2018-02-13 NOTE — Telephone Encounter (Signed)
Noticed that pt is homeless and could not mail letters w/ appointments. Will contact again on Monday.

## 2018-02-13 NOTE — Telephone Encounter (Signed)
Called pt to get her scheduled for her non-stat hcg next week. No answer, and the mailbox is full. Made appts for non-stat and f/u w/ provider for SAB and mailed appointments.

## 2018-02-16 ENCOUNTER — Telehealth: Payer: Self-pay | Admitting: Family Medicine

## 2018-02-16 ENCOUNTER — Other Ambulatory Visit: Payer: Self-pay | Admitting: *Deleted

## 2018-02-16 DIAGNOSIS — O039 Complete or unspecified spontaneous abortion without complication: Secondary | ICD-10-CM

## 2018-02-16 NOTE — Telephone Encounter (Signed)
Called pt w/ her 11/27 @ 10:30. No answer, LVM with the appointments.

## 2018-02-18 ENCOUNTER — Other Ambulatory Visit: Payer: Medicaid Other

## 2018-02-25 ENCOUNTER — Ambulatory Visit: Payer: Medicaid Other | Admitting: Family Medicine

## 2018-02-26 ENCOUNTER — Inpatient Hospital Stay (HOSPITAL_COMMUNITY)
Admission: AD | Admit: 2018-02-26 | Discharge: 2018-02-26 | Disposition: A | Discharge status: Home / Self Care | Payer: Medicaid Other | Attending: Obstetrics and Gynecology | Admitting: Obstetrics and Gynecology

## 2018-02-26 ENCOUNTER — Ambulatory Visit (INDEPENDENT_AMBULATORY_CARE_PROVIDER_SITE_OTHER): Payer: Self-pay | Admitting: Nurse Practitioner

## 2018-02-26 ENCOUNTER — Encounter: Payer: Self-pay | Admitting: Nurse Practitioner

## 2018-02-26 ENCOUNTER — Ambulatory Visit: Payer: Medicaid Other | Admitting: Obstetrics and Gynecology

## 2018-02-26 VITALS — BP 102/71 | HR 118 | Ht 65.0 in | Wt 129.9 lb

## 2018-02-26 DIAGNOSIS — O039 Complete or unspecified spontaneous abortion without complication: Secondary | ICD-10-CM | POA: Insufficient documentation

## 2018-02-26 NOTE — Progress Notes (Signed)
GYNECOLOGY OFFICE VISIT NOTE   History:  32 y.o. G3P1011 here today for miscarriage followup. She continues to have some vaginal bleeding denies any abnormal vaginal discharge, pelvic pain.   She is very tearful and has no appetite.  Is having difficulty sleeping at night.  Reports she has lots of stress but does not elaborate.  Partner accompanies her today but he does not offer any additional information other than she is not eating.  Currently has an almost finished soda.  Past Medical History:  Diagnosis Date  . Anxiety   . Back pain, chronic   . Seizures (HCC)     Past Surgical History:  Procedure Laterality Date  . BREAST SURGERY    . COSMETIC SURGERY      The following portions of the patient's history were reviewed and updated as appropriate: allergies, current medications, past family history, past medical history, past social history, past surgical history and problem list.    Review of Systems:  Pertinent items noted in HPI and remainder of comprehensive ROS otherwise negative.  Objective:  Physical Exam BP 102/71   Pulse (!) 118   Ht 5\' 5"  (1.651 m)   Wt 129 lb 14.4 oz (58.9 kg)   LMP 12/07/2017 (Within Days)   BMI 21.62 kg/m  CONSTITUTIONAL: Well-developed, well-nourished female in no acute distress.  HENT:  Normocephalic, atraumatic. External right and left ear normal.  EYES: Conjunctivae and EOM are normal. Pupils are equal, round.  No scleral icterus.  NECK: Normal range of motion, supple, no masses SKIN: Skin is warm and dry. No rash noted. Not diaphoretic. No erythema. No pallor. NEUROLOGIC: Alert and oriented to person, place, and time. Normal muscle tone coordination. No cranial nerve deficit noted. PSYCHIATRIC: crying. Normal behavior. Normal judgment and thought content. CARDIOVASCULAR: Mild tachycardia noted on vitals. RESPIRATORY: Effort normal, no problems with respiration noted PELVIC: Deferred MUSCULOSKELETAL: Normal range of motion. No edema  noted.  Labs and Imaging US Ob Comp Less 14 Wks  Result Date: 02/11/2018 CLINICAL DATA:  Vaginal bleeding. Estimated gestational age by LMP is 9 weeks 3 days. No quantitative beta HCG available. EXAM: OBSTETRIC <14 WK [place] AND TRANSVAGINAL OB US TECHNIQUE: Both transabdominal and transvaginal ultrasound examinations were performed for complete evaluation of the gestation as well as the maternal uterus, adnexal regions, and pelvic cul-de-sac. Transvaginal technique was performed to assess early pregnancy. COMPARISON:  None. FINDINGS: Intrauterine gestational sac: A single intrauterine gestational sac is identified. There is material within the gestational sac which is not well defined. This may represent a fetal pole and yolk sac. Yolk sac:  Not definitely identified. Embryo:  Not definitely identified. Cardiac Activity: Not identified. MSD: 17.1 mm   6 w   4 d Subchorionic hemorrhage:  None visualized. Maternal uterus/adnexae: Uterus is mildly anteverted. No myometrial mass lesions. Both ovaries are visualized. Presumed corpus luteal cyst on the right. No abnormal adnexal masses. No free fluid in the pelvis. IMPRESSION: Probable early intrauterine gestational sac, but no yolk sac, fetal pole, or cardiac activity yet visualized. Recommend follow-up quantitative B-HCG levels and follow-up US in 14 days to assess viability. This recommendation follows SRU consensus guidelines: Diagnostic Criteria for Nonviable Pregnancy Early in the First Trimester. Malva Limes Med 2013; 161:0960-45. Electronically Signed   By: Burman Nieves M.D.   On: 02/11/2018 03:26   US Ob Transvaginal  Result Date: 02/12/2018 CLINICAL DATA:  Pregnant patient with vaginal bleeding. EXAM: TRANSVAGINAL OB ULTRASOUND TECHNIQUE: Transvaginal ultrasound was performed for  complete evaluation of the gestation as well as the maternal uterus, adnexal regions, and pelvic cul-de-sac. COMPARISON:  Pelvic ultrasound 02/11/2018 FINDINGS: Intrauterine  gestational sac: None Yolk sac:  Not Visualized. Embryo:  Not Visualized. Cardiac Activity: Not Visualized. Maternal uterus/adnexae: No intrauterine gestational sac identified. The endometrium is thickened and heterogeneous measuring up to 24 mm. Normal right and left ovaries. No free fluid in the pelvis. IMPRESSION: No intrauterine gestation identified. Suspected intrauterine gestational sac identified on prior ultrasound is no longer present, potentially secondary to interval spontaneous abortion. Thickened heterogeneous endometrium measuring up to 24 mm may represent blood products. Retained products of conception not excluded. Recommend continued clinical follow-up and potential short-term follow-up pelvic ultrasound to assess for interval improvement/resolution. Electronically Signed   By: Annia Belt M.D.   On: 02/12/2018 17:43   US Ob Transvaginal  Result Date: 02/11/2018 CLINICAL DATA:  Vaginal bleeding. Estimated gestational age by LMP is 9 weeks 3 days. No quantitative beta HCG available. EXAM: OBSTETRIC <14 WK [place] AND TRANSVAGINAL OB US TECHNIQUE: Both transabdominal and transvaginal ultrasound examinations were performed for complete evaluation of the gestation as well as the maternal uterus, adnexal regions, and pelvic cul-de-sac. Transvaginal technique was performed to assess early pregnancy. COMPARISON:  None. FINDINGS: Intrauterine gestational sac: A single intrauterine gestational sac is identified. There is material within the gestational sac which is not well defined. This may represent a fetal pole and yolk sac. Yolk sac:  Not definitely identified. Embryo:  Not definitely identified. Cardiac Activity: Not identified. MSD: 17.1 mm   6 w   4 d Subchorionic hemorrhage:  None visualized. Maternal uterus/adnexae: Uterus is mildly anteverted. No myometrial mass lesions. Both ovaries are visualized. Presumed corpus luteal cyst on the right. No abnormal adnexal masses. No free fluid in the pelvis.  IMPRESSION: Probable early intrauterine gestational sac, but no yolk sac, fetal pole, or cardiac activity yet visualized. Recommend follow-up quantitative B-HCG levels and follow-up US in 14 days to assess viability. This recommendation follows SRU consensus guidelines: Diagnostic Criteria for Nonviable Pregnancy Early in the First Trimester. Malva Limes Med 2013; 161:0960-45. Electronically Signed   By: Burman Nieves M.D.   On: 02/11/2018 03:26    Assessment & Plan:  1. Miscarriage Not coping well with miscarriage and other stress in her life.  Will look at labs and message sent to [facility] specialist in clinic to call patient.  Client was thinking this would be a helpful intervention for her. Advised no sex until client is ready and to use condoms always until she has had 2 normal menstrual cycle.  Discussed getting pregnant immediately increases the risk of another miscarriage.   Advised her not to smoke until she has eaten some healthy foods including protein as she needs self care including appropriate nutrition and 64 oz of water daily. Client reports she has lots of written info on miscarriage and what to expect afterwards but has not had the energy to seek any help for herself.  - CBC - B-HCG Quant   Routine preventative health maintenance measures emphasized. Please refer to After Visit Summary for other counseling recommendations.   Return in about 4 weeks (around 03/26/2018).  Need to check coping status of client as she is not eating or sleeping well at this visit.  Likely quant will not be back to zero today and will need another test.   Total face-to-face time with patient: 15 minutes.  Over 50% of encounter was spent on counseling and coordination of  care.  Nolene BernheimERRI BURLESON, RN, MSN, NP-BC Nurse Practitioner, [facility], [facility] 02/26/2018 8:00 PM

## 2018-02-27 LAB — CBC
Hematocrit: 37.8 % (ref 34.0–46.6)
Hemoglobin: 13.3 g/dL (ref 11.1–15.9)
MCH: 32.9 pg (ref 26.6–33.0)
MCHC: 35.2 g/dL (ref 31.5–35.7)
MCV: 94 fL (ref 79–97)
Platelets: 293 10*3/uL (ref 150–450)
RBC: 4.04 x10E6/uL (ref 3.77–5.28)
RDW: 11.6 % — ABNORMAL LOW (ref 12.3–15.4)
WBC: 7.4 10*3/uL (ref 3.4–10.8)

## 2018-03-03 ENCOUNTER — Telehealth: Payer: Self-pay | Admitting: Clinical

## 2018-03-03 NOTE — Telephone Encounter (Signed)
Pt follow-up; pt agrees that she will call back [facility] Clinician, Asher MuirJamie, at [phone], to set up an appointment prior to [facility], likely later this afternoon.

## 2018-03-30 ENCOUNTER — Ambulatory Visit: Payer: Medicaid Other | Admitting: Advanced Practice Midwife

## 2018-03-30 ENCOUNTER — Ambulatory Visit: Payer: Medicaid Other

## 2018-12-26 ENCOUNTER — Encounter (HOSPITAL_COMMUNITY): Payer: Self-pay

## 2019-09-01 ENCOUNTER — Other Ambulatory Visit: Payer: Self-pay

## 2019-09-01 ENCOUNTER — Emergency Department (HOSPITAL_COMMUNITY): Payer: Medicaid Other

## 2019-09-01 ENCOUNTER — Inpatient Hospital Stay (HOSPITAL_COMMUNITY)
Admission: EM | Admit: 2019-09-01 | Discharge: 2019-09-04 | DRG: 917 | Disposition: A | Discharge status: Home / Self Care | Payer: Medicaid Other | Attending: Family Medicine | Admitting: Family Medicine

## 2019-09-01 DIAGNOSIS — F1123 Opioid dependence with withdrawal: Secondary | ICD-10-CM | POA: Diagnosis not present

## 2019-09-01 DIAGNOSIS — Z590 Homelessness: Secondary | ICD-10-CM

## 2019-09-01 DIAGNOSIS — F1193 Opioid use, unspecified with withdrawal: Secondary | ICD-10-CM

## 2019-09-01 DIAGNOSIS — F419 Anxiety disorder, unspecified: Secondary | ICD-10-CM | POA: Diagnosis present

## 2019-09-01 DIAGNOSIS — G92 Toxic encephalopathy: Secondary | ICD-10-CM | POA: Diagnosis present

## 2019-09-01 DIAGNOSIS — E872 Acidosis: Secondary | ICD-10-CM | POA: Diagnosis present

## 2019-09-01 DIAGNOSIS — T50901A Poisoning by unspecified drugs, medicaments and biological substances, accidental (unintentional), initial encounter: Principal | ICD-10-CM | POA: Diagnosis present

## 2019-09-01 DIAGNOSIS — F1990 Other psychoactive substance use, unspecified, uncomplicated: Secondary | ICD-10-CM | POA: Diagnosis present

## 2019-09-01 DIAGNOSIS — R112 Nausea with vomiting, unspecified: Secondary | ICD-10-CM

## 2019-09-01 DIAGNOSIS — F13239 Sedative, hypnotic or anxiolytic dependence with withdrawal, unspecified: Secondary | ICD-10-CM | POA: Diagnosis not present

## 2019-09-01 DIAGNOSIS — Z20822 Contact with and (suspected) exposure to covid-19: Secondary | ICD-10-CM | POA: Diagnosis present

## 2019-09-01 DIAGNOSIS — G8929 Other chronic pain: Secondary | ICD-10-CM | POA: Diagnosis present

## 2019-09-01 DIAGNOSIS — R4182 Altered mental status, unspecified: Secondary | ICD-10-CM

## 2019-09-01 DIAGNOSIS — E860 Dehydration: Secondary | ICD-10-CM | POA: Diagnosis present

## 2019-09-01 DIAGNOSIS — Z79899 Other long term (current) drug therapy: Secondary | ICD-10-CM

## 2019-09-01 DIAGNOSIS — E876 Hypokalemia: Secondary | ICD-10-CM | POA: Diagnosis not present

## 2019-09-01 DIAGNOSIS — T675XXA Heat exhaustion, unspecified, initial encounter: Secondary | ICD-10-CM | POA: Diagnosis present

## 2019-09-01 DIAGNOSIS — F1190 Opioid use, unspecified, uncomplicated: Secondary | ICD-10-CM

## 2019-09-01 DIAGNOSIS — F17210 Nicotine dependence, cigarettes, uncomplicated: Secondary | ICD-10-CM | POA: Diagnosis present

## 2019-09-01 DIAGNOSIS — G9341 Metabolic encephalopathy: Secondary | ICD-10-CM

## 2019-09-01 LAB — COMPREHENSIVE METABOLIC PANEL
ALT: 14 U/L (ref 0–44)
AST: 19 U/L (ref 15–41)
Albumin: 4.1 g/dL (ref 3.5–5.0)
Alkaline Phosphatase: 63 U/L (ref 38–126)
Anion gap: 12 (ref 5–15)
BUN: 15 mg/dL (ref 6–20)
CO2: 23 mmol/L (ref 22–32)
Calcium: 9.1 mg/dL (ref 8.9–10.3)
Chloride: 105 mmol/L (ref 98–111)
Creatinine, Ser: 0.97 mg/dL (ref 0.44–1.00)
GFR calc Af Amer: >60 mL/min (ref >60)
GFR calc non Af Amer: >60 mL/min (ref >60)
Glucose, Bld: 129 mg/dL — ABNORMAL HIGH (ref 70–99)
Potassium: 3.7 mmol/L (ref 3.5–5.1)
Sodium: 140 mmol/L (ref 135–145)
Total Bilirubin: 0.7 mg/dL (ref 0.3–1.2)
Total Protein: 7.9 g/dL (ref 6.5–8.1)

## 2019-09-01 LAB — CBC WITH DIFFERENTIAL/PLATELET
Abs Immature Granulocytes: 0.09 10*3/uL — ABNORMAL HIGH (ref 0.00–0.07)
Basophils Absolute: 0 10*3/uL (ref 0.0–0.1)
Basophils Relative: 0 %
Eosinophils Absolute: 0 10*3/uL (ref 0.0–0.5)
Eosinophils Relative: 0 %
HCT: 43.5 % (ref 36.0–46.0)
Hemoglobin: 14.3 g/dL (ref 12.0–15.0)
Immature Granulocytes: 1 %
Lymphocytes Relative: 6 %
Lymphs Abs: 1.1 10*3/uL (ref 0.7–4.0)
MCH: 30.3 pg (ref 26.0–34.0)
MCHC: 32.9 g/dL (ref 30.0–36.0)
MCV: 92.2 fL (ref 80.0–100.0)
Monocytes Absolute: 0.9 10*3/uL (ref 0.1–1.0)
Monocytes Relative: 5 %
Neutro Abs: 16 10*3/uL — ABNORMAL HIGH (ref 1.7–7.7)
Neutrophils Relative %: 88 %
Platelets: 432 10*3/uL — ABNORMAL HIGH (ref 150–400)
RBC: 4.72 MIL/uL (ref 3.87–5.11)
RDW: 12.9 % (ref 11.5–15.5)
WBC: 18.1 10*3/uL — ABNORMAL HIGH (ref 4.0–10.5)
nRBC: 0 % (ref 0.0–0.2)

## 2019-09-01 LAB — URINALYSIS, ROUTINE W REFLEX MICROSCOPIC
Bilirubin Urine: NEGATIVE
Glucose, UA: NEGATIVE mg/dL
Hgb urine dipstick: NEGATIVE
Ketones, ur: 20 mg/dL — AB
Leukocytes,Ua: NEGATIVE
Nitrite: NEGATIVE
Protein, ur: 30 mg/dL — AB
Specific Gravity, Urine: 1.029 (ref 1.005–1.030)
pH: 5 (ref 5.0–8.0)

## 2019-09-01 LAB — ETHANOL: Alcohol, Ethyl (B): <10 mg/dL (ref <10)

## 2019-09-01 LAB — RAPID URINE DRUG SCREEN, HOSP PERFORMED
Amphetamines: POSITIVE — AB
Barbiturates: NOT DETECTED
Benzodiazepines: POSITIVE — AB
Cocaine: NOT DETECTED
Opiates: NOT DETECTED
Tetrahydrocannabinol: NOT DETECTED

## 2019-09-01 LAB — CK: Total CK: 35 U/L — ABNORMAL LOW (ref 38–234)

## 2019-09-01 LAB — SALICYLATE LEVEL: Salicylate Lvl: <7 mg/dL — ABNORMAL LOW (ref 7.0–30.0)

## 2019-09-01 LAB — HCG, QUANTITATIVE, PREGNANCY: hCG, Beta Chain, Quant, S: <1 m[IU]/mL (ref <5)

## 2019-09-01 LAB — ACETAMINOPHEN LEVEL: Acetaminophen (Tylenol), Serum: <10 ug/mL — ABNORMAL LOW (ref 10–30)

## 2019-09-01 MED ORDER — SODIUM CHLORIDE 0.9 % IV BOLUS
1000.0000 mL | Freq: Once | INTRAVENOUS | Status: AC
  Administered 2019-09-01: 1000 mL via INTRAVENOUS

## 2019-09-01 MED ORDER — ONDANSETRON HCL 4 MG/2ML IJ SOLN
4.0000 mg | Freq: Once | INTRAMUSCULAR | Status: AC
  Administered 2019-09-01: 4 mg via INTRAVENOUS
  Filled 2019-09-01: qty 2

## 2019-09-01 NOTE — ED Provider Notes (Addendum)
Care transferred from [place], [place] at shift change.  See note for full HPI  In summation 34 year old female presents for evaluation of altered mental status.  Apparently found in a car when it was not running in a parking lot.  Admits to heroin use however apparently she denies IV drug use.  Labs and imaging personally viewed by previous provider.  Plan to follow-up after UA and p.o. challenge. Physical Exam  BP 107/72   Pulse 73   Temp 100 F (37.8 C) (Oral) Comment: she just vomited  Resp 18   SpO2 100%   Physical Exam Physical Exam  Vitals and nursing note reviewed.  Constitutional:  General: She is not in acute distress. Appearance: She is well-developed. She is not toxic-appearing.  HENT:  Head: Normocephalic and atraumatic.  Nose: Nose normal.  Mouth/Throat:  Mouth: Mucous membranes are moist.  Eyes:  Pupils: Pupils are equal, round, and reactive to light.  Cardiovascular:  Rate and Rhythm: Normal rate.  No murmur Pulses: Normal pulses.  Heart sounds: Normal heart sounds.  Pulmonary:  Effort: Pulmonary effort is normal. No respiratory distress.  Speaks in full sentences without difficulty Breath sounds: Normal breath sounds.  Abdominal:  General: Bowel sounds are normal. There is no distension.  Soft, nontender Musculoskeletal:  General: Normal range of motion.  Cervical back: Normal range of motion.  Diffuse tenderness to lumbar region.  No overlying skin changes Negative straight leg raise Compartments soft No bony tenderness Comments: Moves all 4 extremities without difficulty.  Skin: No cellulitis or abscess. General: Skin is warm and dry.  Comments: No rashes or lesions  Neurological:  Comments: Sleepy, slow to arouse. Moves all 4 extremities without difficulty. No facial droop. Tongue midline. Intact sensation. Ambulatory from room to restroom without difficulty intact sensation to extremities Denies SI, HI, AVH.  ED Course/Procedures      Procedures Labs Reviewed  URINALYSIS, ROUTINE W REFLEX MICROSCOPIC - Abnormal; Notable for the following components:      Result Value   Color, Urine AMBER (*)    APPearance HAZY (*)    Ketones, ur 20 (*)    Protein, ur 30 (*)    Bacteria, UA RARE (*)    All other components within normal limits  RAPID URINE DRUG SCREEN, HOSP PERFORMED - Abnormal; Notable for the following components:   Benzodiazepines POSITIVE (*)    Amphetamines POSITIVE (*)    All other components within normal limits  CBC WITH DIFFERENTIAL/PLATELET - Abnormal; Notable for the following components:   WBC 18.1 (*)    Platelets 432 (*)    Neutro Abs 16.0 (*)    Abs Immature Granulocytes 0.09 (*)    All other components within normal limits  COMPREHENSIVE METABOLIC PANEL - Abnormal; Notable for the following components:   Glucose, Bld 129 (*)    All other components within normal limits  CK - Abnormal; Notable for the following components:   Total CK 35 (*)    All other components within normal limits  SALICYLATE LEVEL - Abnormal; Notable for the following components:   Salicylate Lvl <7.0 (*)    All other components within normal limits  ACETAMINOPHEN LEVEL - Abnormal; Notable for the following components:   Acetaminophen (Tylenol), Serum <10 (*)    All other components within normal limits  SARS CORONAVIRUS 2 BY RT PCR (HOSPITAL ORDER, PERFORMED IN [facility])  CULTURE, BLOOD (ROUTINE X 2)  CULTURE, BLOOD (ROUTINE X 2)  HCG, QUANTITATIVE, PREGNANCY  ETHANOL  LACTIC ACID, PLASMA  LACTIC ACID, PLASMA  CT Head Wo Contrast  Result Date: 09/01/2019 CLINICAL DATA:  Confusion. EXAM: CT HEAD WITHOUT CONTRAST TECHNIQUE: Contiguous axial images were obtained from the base of the skull through the vertex without intravenous contrast. COMPARISON:  None. FINDINGS: Brain: No evidence of acute infarction, hemorrhage, hydrocephalus, extra-axial collection or mass lesion/mass effect. Vascular: No hyperdense  vessel or unexpected calcification. Skull: Normal. Negative for fracture or focal lesion. Sinuses/Orbits: No acute finding. Other: None. IMPRESSION: No acute intracranial pathology. Electronically Signed   By: Katherine Mantle M.D.   On: 09/01/2019 20:15   DG Chest Portable 1 View  Result Date: 09/01/2019 CLINICAL DATA:  Altered mental status. Found unconscious. EXAM: PORTABLE CHEST 1 VIEW COMPARISON:  None. FINDINGS: The cardiomediastinal contours are normal. Mild defined rounded nodule projecting over the right midlung zone is favored to represent nipple shadow. Pulmonary vasculature is normal. No consolidation, pleural effusion, or pneumothorax. No acute osseous abnormalities are seen. IMPRESSION: No acute chest findings. Probable nipple shadow projecting over the right midlung zone. Consider follow-up exam with nipple markers. Electronically Signed   By: Narda Rutherford M.D.   On: 09/01/2019 15:46   MDM  Care transferred from previous provider at shift change.  Plan to follow-up on UA, ambulate p.o. challenge.  Likely DC home.  On assessment patient is somnolent.  Appears confused.    Patient is unable to tell me why she is here in the emergency department. We will plan on adding additional labs, imaging and reassess  Patient with low-grade temperature.  Does have history of back pain.  I recommended MRI to assess for infectious process.  Patient declines this.  She continues to be intermittently somnolent with emesis. Dr. Pilar Plate and I have discussed lumbar puncture with patient.  When I mention this patient briskly set up in bed and yelled no.  She is requesting food.  Labs and imaging personally reviewed and interpreted.    Patient reassessed.  Has had some liquids.  Again she is ambulated to the restroom without difficulty.  She does have diffuse twitching unsure if due to drug withdrawal or intoxication.    Patient is requesting to leave. Appears to have a capacity make medical decisions,  knows name, date of birth, location currently. Patient knows she is leaving AGAINST MEDICAL ADVICE.  Patient seen eval by attending, Dr. Pilar Plate who agrees with the treatment, plan and disposition.  Nursing has notified me that patient continues to have nausea and vomiting was unable to leave the ED 2/2 emesis.  Patient is been here in the emergency department for 9 hours with IV fluids and multiple antiemetics.  Will ultimately need admission for intractable vomiting likely from substance use however adamanately declines any additional imaging at this time ie. to r/o infectious process of back pain. Patient is agreeable to admission at this time. Declines repeat oral or rectal temp at this time.  Consult with Dr. Toniann Fail who agrees to evaluate patient for admission. Recommends blood cultures, lactic. Additional IVF ordered.  Collateral information from patient's mother via nursing.  Apparently patient has polysubstance use issues.  She was at the mother's house yesterday evening when patient left in the light to "get high."  Mother does not want patient around her or her children.  Unfortunately overall patient unable to get history from due to her substance use. Concern for possible infectious process however patient is unwilling to do any more invasive testing at this time.  The patient appears reasonably  stabilized for admission considering the current resources, flow, and capabilities available in the ED at this time, and I doubt any other [facility] requiring further screening and/or treatment in the ED prior to admission.  Impression: Intractable nausea and vomiting Altered mental status Dehydration Polysubstance use       Nicole Hafley A, PA-C 09/02/19 0003    Maudie Flakes, MD 09/02/19 [phone]

## 2019-09-01 NOTE — ED Notes (Signed)
Patient ambulatory to bathroom without difficulty. Patient has a BM and urinated

## 2019-09-01 NOTE — ED Notes (Signed)
Pt vomited on floor even though she had an emesis bag next to her

## 2019-09-01 NOTE — ED Provider Notes (Cosign Needed)
[facility] DEPT Provider Note   CSN: [id number] Arrival date & time: 09/01/19  1410     History Chief Complaint  Patient presents with  . Altered Mental Status    Colleen Evans is a 34 y.o. female with no significant past medical history presents to the ED via EMS for altered mental status and heat exhaustion.  Patient was found in a parking lot by [facility] staff unresponsive with the windows up in a engine off.  911 was called and patient was found to be unresponsive and diaphoretic with initial temperature of 101.1 F.  I obtained history from EMS reports that she received 700 mL IV NS which resolved her initial tachycardia in 150s.  By the time she arrived to the ED, her vital signs were all WNL and patient was no longer febrile or diaphoretic.  On my examination, patient is making good eye contact and can respond appropriately to verbal instruction.  However, she has not verbally responded to any my questions, only with nodding and shaking her.  Level 5 caveat due to altered mental status.    HPI     Past Medical History:  Diagnosis Date  . Anxiety   . Back pain, chronic   . Seizures [facility])     Patient Active Problem List   Diagnosis Date Noted  . Miscarriage 02/26/2018    Past Surgical History:  Procedure Laterality Date  . BREAST SURGERY    . COSMETIC SURGERY       OB History    Gravida  3   Para  1   Term  1   Preterm      AB  1   Living  1     SAB  1   TAB      Ectopic      Multiple      Live Births  1           Family History  Problem Relation Age of Onset  . Diabetes Maternal Grandmother   . Kidney disease Maternal Grandmother     Social History   Tobacco Use  . Smoking status: Current Some Day Smoker    Packs/day: 0.25    Types: Cigarettes  . Smokeless tobacco: Never Used  Substance Use Topics  . Alcohol use: No  . Drug use: No    Home Medications Prior to Admission medications     Medication Sig Start Date End Date Taking? Authorizing Provider  acetaminophen (TYLENOL) 325 MG tablet Take 650 mg by mouth every 6 (six) hours as needed for moderate pain.    [provider]  ALPRAZolam Duanne Moron) 1 MG tablet Take 1 mg by mouth 3 (three) times daily as needed for anxiety.     [provider]  citalopram (CELEXA) 40 MG tablet Take 20 mg by mouth daily.  12/29/17   [provider]  ibuprofen (ADVIL,MOTRIN) 200 MG tablet Take 200 mg by mouth every 6 (six) hours as needed.    [provider]  prenatal vitamin w/FE, FA (PRENATAL 1 + 1) 27-1 MG TABS tablet Take 1 tablet by mouth daily at 12 noon.    [provider]    Allergies    Patient has no known allergies.  Review of Systems   Review of Systems  Unable to perform ROS: Mental status change    Physical Exam Updated Vital Signs BP 128/87 (BP Location: Right Arm)   Pulse 73   Temp 100 F (  37.8 C) (Oral) Comment: she just vomited  Resp 18   SpO2 100%   Physical Exam Vitals and nursing note reviewed. Exam conducted with a chaperone present.  Constitutional:      Comments: Mildly shaking.  HENT:     Head: Normocephalic and atraumatic.  Eyes:     General: No scleral icterus.    Conjunctiva/sclera: Conjunctivae normal.  Cardiovascular:     Rate and Rhythm: Regular rhythm. Tachycardia present.     Pulses: Normal pulses.     Heart sounds: Normal heart sounds.  Pulmonary:     Effort: Pulmonary effort is normal.     Comments: No increased work of breathing.  Symmetric chest rise.  Breath sounds intact bilaterally.   Abdominal:     Comments: Soft, nondistended.  No areas of tenderness.  No guarding.  Musculoskeletal:     Cervical back: Normal range of motion.  Skin:    General: Skin is dry.     Capillary Refill: Capillary refill takes less than 2 seconds.  Neurological:     Mental Status: She is alert.     GCS: GCS eye subscore is 4. GCS verbal subscore is 5. GCS motor  subscore is 6.     Comments: CN II through XII grossly intact.  Sensation intact throughout.  EOM intact and PERRL.  Psychiatric:        Mood and Affect: Mood normal.        Behavior: Behavior normal.        Thought Content: Thought content normal.     ED Results / Procedures / Treatments   Labs (all labs ordered are listed, but only abnormal results are displayed) Labs Reviewed  CBC WITH DIFFERENTIAL/PLATELET - Abnormal; Notable for the following components:      Result Value   WBC 18.1 (*)    Platelets 432 (*)    Neutro Abs 16.0 (*)    Abs Immature Granulocytes 0.09 (*)    All other components within normal limits  COMPREHENSIVE METABOLIC PANEL - Abnormal; Notable for the following components:   Glucose, Bld 129 (*)    All other components within normal limits  URINALYSIS, ROUTINE W REFLEX MICROSCOPIC  RAPID URINE DRUG SCREEN, HOSP PERFORMED  POC URINE PREG, ED    EKG None  Radiology DG Chest Portable 1 View  Result Date: 09/01/2019 CLINICAL DATA:  Altered mental status. Found unconscious. EXAM: PORTABLE CHEST 1 VIEW COMPARISON:  None. FINDINGS: The cardiomediastinal contours are normal. Mild defined rounded nodule projecting over the right midlung zone is favored to represent nipple shadow. Pulmonary vasculature is normal. No consolidation, pleural effusion, or pneumothorax. No acute osseous abnormalities are seen. IMPRESSION: No acute chest findings. Probable nipple shadow projecting over the right midlung zone. Consider follow-up exam with nipple markers. Electronically Signed   By: Narda Rutherford M.D.   On: 09/01/2019 15:46    Procedures Procedures (including critical care time)  Medications Ordered in ED Medications  ondansetron (ZOFRAN) injection 4 mg (has no administration in time range)  sodium chloride 0.9 % bolus 1,000 mL (1,000 mLs Intravenous New Bag/Given 09/01/19 1507)    ED Course  I have reviewed the triage vital signs and the nursing  notes.  Pertinent labs & imaging results that were available during my care of the patient were reviewed by me and considered in my medical decision making (see chart for details).    MDM Rules/Calculators/A&P  On subsequent evaluation, patient is initially reluctant to speak but after a while she opens up and begins answering questions.  She is still grossly intact neurologically, but when asked if she hurts anywhere she states that she recently developed atraumatic mid-back discomfort.  On examination, she is mildly tender over midline spine in lower thoracic / upper lumbar region.  No overlying skin changes.  Given her frequency of heroin, will obtain MRI of lumbar and thoracic spine to assess for epidural abscess.  She tells me that she eats the heroin and denies IVDA, however I discussed this with Dr. Rhunette Croft and given her frequent substance use and atraumatic mid back discomfort with midline tenderness, will refer to err on side of caution and obtain imaging.  Her CBC demonstrates a leukocytosis to 18.1 with left shift and she is persistently febrile despite being placed in a cool room with cold cloth over her forehead and resuscitated with 1.7 L IVF.  Her CMP is unremarkable.  I asked patient if she takes overdose of drugs and states that she takes her prescribed Xanax.  She recently ran out of her Xanax, but states that she was not self-medicating with heroin but rather likes to use it recreationally.  I asked her if she has ever sought rehabilitation or was interested in getting help with her substance use disorder, and she declined.    Dr. Rhunette Croft personally evaluated patient and she is now stating her atraumatic back pain has been going on for a couple of weeks.  I reviewed patient's medical record and it appears as though she used to take oxycodone for chronic back pain.  On exam she has no obvious track marks.  We discussed the risks of permanent neurologic dysfunction  or death if she is actually using IV heroin and we do not obtain MRI to evaluate for epidural abscess, however she adamantly declines and maintains that she eats the heroin.   Current plan is to control her nausea symptoms which are likely attributable to her heat exposure and illicit drug use.  Repeat abdominal exam was benign.  We will need to ambulate her prior to discharge.  If she can tolerate food and liquid by mouth and ambulating without difficulty, she is safe for discharge with resources for substance use programs.  Patient reports that she lives at home with her family.  Her UA, UDS, and urine pregnancy is still pending.  PureWick has been inserted and still no urine at this time.  Patient is shaking mildly on exam, states because she is cold because we turned down temperature in the room.    At shift change care was transferred to [facility], PA-C who will follow pending studies, re-evaluate, and determine disposition.     Final Clinical Impression(s) / ED Diagnoses Final diagnoses:  Heroin use  Dehydration    Rx / DC Orders ED Discharge Orders    None       Lorelee New, PA-C 09/01/19 1806

## 2019-09-01 NOTE — ED Triage Notes (Signed)
Per EMS: Patient is coming from across the [employer] parking lot. Patient was in the car with the windows rolled up and car off. Patient was hot and altered. Possible drug paraphernalia found in the car

## 2019-09-01 NOTE — Discharge Instructions (Signed)
Please read the attachment on opiate use disorder as well as reviewed the resource guide for substance use counseling and available resources here in the community.    I also highly encourage you to establish with a primary care provider for ongoing evaluation and management of your health and wellbeing.  Please contact the [facility] to establish care with a provider.

## 2019-09-01 NOTE — ED Notes (Signed)
Patient reports she is withdrawing from heroin. Fluids started. PA made aware

## 2019-09-02 ENCOUNTER — Other Ambulatory Visit: Payer: Self-pay

## 2019-09-02 ENCOUNTER — Encounter (HOSPITAL_COMMUNITY): Payer: Self-pay | Admitting: Internal Medicine

## 2019-09-02 DIAGNOSIS — R112 Nausea with vomiting, unspecified: Secondary | ICD-10-CM | POA: Diagnosis not present

## 2019-09-02 DIAGNOSIS — Z20822 Contact with and (suspected) exposure to covid-19: Secondary | ICD-10-CM | POA: Diagnosis present

## 2019-09-02 DIAGNOSIS — F13239 Sedative, hypnotic or anxiolytic dependence with withdrawal, unspecified: Secondary | ICD-10-CM | POA: Diagnosis not present

## 2019-09-02 DIAGNOSIS — T50901A Poisoning by unspecified drugs, medicaments and biological substances, accidental (unintentional), initial encounter: Secondary | ICD-10-CM | POA: Diagnosis present

## 2019-09-02 DIAGNOSIS — G8929 Other chronic pain: Secondary | ICD-10-CM | POA: Diagnosis present

## 2019-09-02 DIAGNOSIS — Z590 Homelessness: Secondary | ICD-10-CM | POA: Diagnosis not present

## 2019-09-02 DIAGNOSIS — F419 Anxiety disorder, unspecified: Secondary | ICD-10-CM | POA: Diagnosis present

## 2019-09-02 DIAGNOSIS — E860 Dehydration: Secondary | ICD-10-CM | POA: Diagnosis present

## 2019-09-02 DIAGNOSIS — T50904A Poisoning by unspecified drugs, medicaments and biological substances, undetermined, initial encounter: Secondary | ICD-10-CM | POA: Diagnosis not present

## 2019-09-02 DIAGNOSIS — R1110 Vomiting, unspecified: Secondary | ICD-10-CM | POA: Insufficient documentation

## 2019-09-02 DIAGNOSIS — R4182 Altered mental status, unspecified: Secondary | ICD-10-CM | POA: Diagnosis not present

## 2019-09-02 DIAGNOSIS — Z79899 Other long term (current) drug therapy: Secondary | ICD-10-CM | POA: Diagnosis not present

## 2019-09-02 DIAGNOSIS — E872 Acidosis: Secondary | ICD-10-CM | POA: Diagnosis present

## 2019-09-02 DIAGNOSIS — F1123 Opioid dependence with withdrawal: Secondary | ICD-10-CM | POA: Diagnosis not present

## 2019-09-02 DIAGNOSIS — E876 Hypokalemia: Secondary | ICD-10-CM | POA: Diagnosis not present

## 2019-09-02 DIAGNOSIS — T675XXA Heat exhaustion, unspecified, initial encounter: Secondary | ICD-10-CM | POA: Diagnosis present

## 2019-09-02 DIAGNOSIS — F17210 Nicotine dependence, cigarettes, uncomplicated: Secondary | ICD-10-CM | POA: Diagnosis present

## 2019-09-02 DIAGNOSIS — G92 Toxic encephalopathy: Secondary | ICD-10-CM | POA: Diagnosis present

## 2019-09-02 DIAGNOSIS — F1990 Other psychoactive substance use, unspecified, uncomplicated: Secondary | ICD-10-CM | POA: Diagnosis present

## 2019-09-02 LAB — CBC WITH DIFFERENTIAL/PLATELET
Abs Immature Granulocytes: 0.04 10*3/uL (ref 0.00–0.07)
Basophils Absolute: 0 10*3/uL (ref 0.0–0.1)
Basophils Relative: 0 %
Eosinophils Absolute: 0 10*3/uL (ref 0.0–0.5)
Eosinophils Relative: 0 %
HCT: 34.9 % — ABNORMAL LOW (ref 36.0–46.0)
Hemoglobin: 11.5 g/dL — ABNORMAL LOW (ref 12.0–15.0)
Immature Granulocytes: 0 %
Lymphocytes Relative: 11 %
Lymphs Abs: 1.3 10*3/uL (ref 0.7–4.0)
MCH: 30.7 pg (ref 26.0–34.0)
MCHC: 33 g/dL (ref 30.0–36.0)
MCV: 93.3 fL (ref 80.0–100.0)
Monocytes Absolute: 0.9 10*3/uL (ref 0.1–1.0)
Monocytes Relative: 8 %
Neutro Abs: 9.6 10*3/uL — ABNORMAL HIGH (ref 1.7–7.7)
Neutrophils Relative %: 81 %
Platelets: 322 10*3/uL (ref 150–400)
RBC: 3.74 MIL/uL — ABNORMAL LOW (ref 3.87–5.11)
RDW: 13.4 % (ref 11.5–15.5)
WBC: 11.9 10*3/uL — ABNORMAL HIGH (ref 4.0–10.5)
nRBC: 0 % (ref 0.0–0.2)

## 2019-09-02 LAB — HEPATIC FUNCTION PANEL
ALT: 10 U/L (ref 0–44)
AST: 16 U/L (ref 15–41)
Albumin: 3.4 g/dL — ABNORMAL LOW (ref 3.5–5.0)
Alkaline Phosphatase: 47 U/L (ref 38–126)
Bilirubin, Direct: 0.1 mg/dL (ref 0.0–0.2)
Indirect Bilirubin: 0.7 mg/dL (ref 0.3–0.9)
Total Bilirubin: 0.8 mg/dL (ref 0.3–1.2)
Total Protein: 6.5 g/dL (ref 6.5–8.1)

## 2019-09-02 LAB — SEDIMENTATION RATE: Sed Rate: 5 mm/hr (ref 0–22)

## 2019-09-02 LAB — BASIC METABOLIC PANEL
Anion gap: 11 (ref 5–15)
BUN: 15 mg/dL (ref 6–20)
CO2: 20 mmol/L — ABNORMAL LOW (ref 22–32)
Calcium: 8.2 mg/dL — ABNORMAL LOW (ref 8.9–10.3)
Chloride: 107 mmol/L (ref 98–111)
Creatinine, Ser: 0.62 mg/dL (ref 0.44–1.00)
GFR calc Af Amer: >60 mL/min (ref >60)
GFR calc non Af Amer: >60 mL/min (ref >60)
Glucose, Bld: 124 mg/dL — ABNORMAL HIGH (ref 70–99)
Potassium: 2.9 mmol/L — ABNORMAL LOW (ref 3.5–5.1)
Sodium: 138 mmol/L (ref 135–145)

## 2019-09-02 LAB — HIV ANTIBODY (ROUTINE TESTING W REFLEX): HIV Screen 4th Generation wRfx: NONREACTIVE

## 2019-09-02 LAB — LACTIC ACID, PLASMA
Lactic Acid, Venous: 2.3 mmol/L (ref 0.5–1.9)
Lactic Acid, Venous: 2 mmol/L (ref 0.5–1.9)
Lactic Acid, Venous: 1.2 mmol/L (ref 0.5–1.9)

## 2019-09-02 LAB — ACETAMINOPHEN LEVEL: Acetaminophen (Tylenol), Serum: <10 ug/mL — ABNORMAL LOW (ref 10–30)

## 2019-09-02 LAB — CK: Total CK: 211 U/L (ref 38–234)

## 2019-09-02 LAB — SALICYLATE LEVEL: Salicylate Lvl: <7 mg/dL — ABNORMAL LOW (ref 7.0–30.0)

## 2019-09-02 LAB — POTASSIUM: Potassium: 3.3 mmol/L — ABNORMAL LOW (ref 3.5–5.1)

## 2019-09-02 LAB — TROPONIN I (HIGH SENSITIVITY): Troponin I (High Sensitivity): 11 ng/L (ref <18)

## 2019-09-02 LAB — SARS CORONAVIRUS 2 BY RT PCR (HOSPITAL ORDER, PERFORMED IN ~~LOC~~ HOSPITAL LAB): SARS Coronavirus 2: NEGATIVE

## 2019-09-02 LAB — PROCALCITONIN: Procalcitonin: 0.88 ng/mL

## 2019-09-02 MED ORDER — ONDANSETRON HCL 4 MG/2ML IJ SOLN
4.0000 mg | Freq: Four times a day (QID) | INTRAMUSCULAR | Status: DC | PRN
  Administered 2019-09-02 – 2019-09-03 (×2): 4 mg via INTRAVENOUS
  Filled 2019-09-02 (×2): qty 2

## 2019-09-02 MED ORDER — ONDANSETRON HCL 4 MG PO TABS: 4.0000 mg | ORAL_TABLET | Freq: Four times a day (QID) | ORAL | Status: DC | PRN

## 2019-09-02 MED ORDER — BUPRENORPHINE HCL-NALOXONE HCL 8-2 MG SL SUBL
1.0000 | SUBLINGUAL_TABLET | Freq: Two times a day (BID) | SUBLINGUAL | Status: DC
  Administered 2019-09-03: 1 via SUBLINGUAL
  Filled 2019-09-02 (×3): qty 1

## 2019-09-02 MED ORDER — BUPRENORPHINE HCL-NALOXONE HCL 2-0.5 MG SL SUBL
1.0000 | SUBLINGUAL_TABLET | SUBLINGUAL | Status: AC | PRN
  Administered 2019-09-02 (×3): 1 via SUBLINGUAL
  Filled 2019-09-02 (×3): qty 1

## 2019-09-02 MED ORDER — LORAZEPAM 2 MG/ML IJ SOLN
1.0000 mg | INTRAMUSCULAR | Status: DC | PRN
  Administered 2019-09-02 (×2): 1 mg via INTRAVENOUS
  Filled 2019-09-02 (×2): qty 1

## 2019-09-02 MED ORDER — ENOXAPARIN SODIUM 40 MG/0.4ML ~~LOC~~ SOLN
40.0000 mg | SUBCUTANEOUS | Status: DC
  Administered 2019-09-02: 40 mg via SUBCUTANEOUS
  Filled 2019-09-02 (×2): qty 0.4

## 2019-09-02 NOTE — Progress Notes (Signed)
Patient seen and examined at bedside, patient admitted after midnight, please see earlier detailed admission note by Eduard Clos, MD. Briefly, patient presented secondary to altered mental status with concern for substance use. While in the ED, it appears she developed opiate withdrawal symptoms. She still shows significant withdrawal symptoms at this time. Unsure of how much heroin she uses or how frequent she uses it.  Likely opiate withdrawal -Continue suboxone -Will start Ativan 1 mg q4 hours prn for irritation/anxiety -Continue Zofran prn -Will hold off on starting clonidine since BP is on the lower end -TOC consulted for resources   Jacquelin Hawking, MD [facility] 09/02/2019, 3:07 PM

## 2019-09-02 NOTE — TOC Initial Note (Signed)
Transition of Care [facility]) - Initial/Assessment Note    Patient Details  Name: Colleen Evans MRN: [id number] Date of Birth: [birth year]  Transition of [facility]) CM/SW Contact:    Joseph Art, LCSWA Phone Number: 09/02/2019, 10:20 AM  Clinical Narrative:                  Patient presents to [facility] due to altered mental state.  This CSW is covering [facility] from [facility].  TOC consult for substance abuse resources and needle exchange resources.  This CSW sent to Attending MD and RNs via IM chat, contact information for [facility] [phone] [place], and [place] pop-up-sites [phone] in [place] and [place], and [employer] of [place] ([facility]) [phone] and 24/7/365 [phone].          Patient Goals and CMS Choice        Expected Discharge Plan and Services                                                Prior Living Arrangements/Services                       Activities of Daily Living      Permission Sought/Granted                  Emotional Assessment              Admission diagnosis:  Drug overdose [T50.901A] Patient Active Problem List   Diagnosis Date Noted  . Drug overdose 09/02/2019  . Intractable vomiting   . Miscarriage 02/26/2018   PCP:  Patient, No Pcp Per Pharmacy:   [id number] [place], [place] - [phone] AT [facility] OF [address] [place] [address] Phone: [phone] Fax: [phone]  [id number] [place], [place] - [address] AT [facility] OF [facility] [address] [place] [address] Phone: [phone] Fax: [phone]     Social Determinants of Health (SDOH) Interventions    Readmission Risk Interventions No flowsheet data found.

## 2019-09-02 NOTE — H&P (Addendum)
History and Physical    Colleen Evans NWG:[id number] DOB: 1985-04-28 DOA: 09/01/2019  PCP: Patient, No Pcp Per  Patient coming from: Patient is homeless.  Patient was examined with chaperone patient's nurse Ms. Sheran Spine.  Chief Complaint: Altered mental status.  HPI: Colleen Evans is a 34 y.o. female with history of anxiety back pain per the report was found to be unresponsive inside the car and altered mental status by EMS.  At the time patient was febrile with temperature 101 F.  Patient was brought to the ER.  ED Course: In the ER patient became more alert awake and stated that she took some heroin.  Patient was mildly febrile.  But following commands and moving all extremities.  CT head chest x-ray and UA were all unremarkable.  Urine drug screen was positive for amphetamine and benzodiazepine.  Patient was not giving a proper history but did admit that she took some heroin.  Later on patient started became more diaphoretic and intractable nausea vomiting.  Did not complain of any abdominal pain but states he has benign exam upper back pain which has been chronic.  Patient admitted for further management of possible drug withdrawal at this time.  Patient denies any suicidal ideation.  Salicylate levels and Tylenol levels were negative.  Review of Systems: As per HPI, rest all negative.   Past Medical History:  Diagnosis Date  . Anxiety   . Back pain, chronic   . Seizures ([place])     Past Surgical History:  Procedure Laterality Date  . BREAST SURGERY    . COSMETIC SURGERY       reports that she has been smoking cigarettes. She has been smoking about 0.25 packs per day. She has never used smokeless tobacco. She reports that she does not drink alcohol or use drugs.  No Known Allergies  Family History  Problem Relation Age of Onset  . Diabetes Maternal Grandmother   . Kidney disease Maternal Grandmother     Prior to Admission medications   Medication Sig Start Date  End Date Taking? Authorizing Provider  acetaminophen (TYLENOL) 325 MG tablet Take 650 mg by mouth every 6 (six) hours as needed for moderate pain.   Yes [provider]  ALPRAZolam Duanne Moron) 1 MG tablet Take 1 mg by mouth 4 (four) times daily as needed for anxiety.    Yes [provider]  amphetamine-dextroamphetamine (ADDERALL) 20 MG tablet Take 20 mg by mouth in the morning, at noon, and at bedtime.   Yes [provider]  citalopram (CELEXA) 40 MG tablet Take 40 mg by mouth daily.  12/29/17  Yes [provider]  ibuprofen (ADVIL,MOTRIN) 200 MG tablet Take 200 mg by mouth every 6 (six) hours as needed for mild pain.    Yes [provider]    Physical Exam: Constitutional: Moderately built and nourished. Vitals:   09/01/19 1923 09/01/19 2134 09/01/19 2238 09/02/19 0000  BP: 114/88 105/75 107/72 116/89  Pulse: 88 78 73 90  Resp: 16 18 18    Temp:      TempSrc:      SpO2: 100% 100% 100% 94%   Eyes: Anicteric no pallor. ENMT: No discharge from the ears eyes nose or mouth. Neck: No neck rigidity. Respiratory: No rhonchi or crepitations. Cardiovascular: S1-S2 heard. Abdomen: Soft nontender bowel sounds present. Musculoskeletal: No edema. Skin: No rash. Neurologic: Alert awake oriented to name and place moves all extremities. Psychiatric: Patient is tearful denies any suicidal ideation.  Labs on Admission: I have personally reviewed following labs and imaging studies  CBC: Recent Labs  Lab 09/01/19 1503  WBC 18.1*  NEUTROABS 16.0*  HGB 14.3  HCT 43.5  MCV 92.2  PLT 432*   Basic Metabolic Panel: Recent Labs  Lab 09/01/19 1503  NA 140  K 3.7  CL 105  CO2 23  GLUCOSE 129*  BUN 15  CREATININE 0.97  CALCIUM 9.1   GFR: CrCl cannot be calculated (Unknown ideal weight.). Liver Function Tests: Recent Labs  Lab 09/01/19 1503  AST 19  ALT 14  ALKPHOS 63  BILITOT 0.7  PROT 7.9  ALBUMIN 4.1   No results for input(s): LIPASE,  AMYLASE in the last 168 hours. No results for input(s): AMMONIA in the last 168 hours. Coagulation Profile: No results for input(s): INR, PROTIME in the last 168 hours. Cardiac Enzymes: Recent Labs  Lab 09/01/19 1503  CKTOTAL 35*   BNP (last 3 results) No results for input(s): PROBNP in the last 8760 hours. HbA1C: No results for input(s): HGBA1C in the last 72 hours. CBG: No results for input(s): GLUCAP in the last 168 hours. Lipid Profile: No results for input(s): CHOL, HDL, LDLCALC, TRIG, CHOLHDL, LDLDIRECT in the last 72 hours. Thyroid Function Tests: No results for input(s): TSH, T4TOTAL, FREET4, T3FREE, THYROIDAB in the last 72 hours. Anemia Panel: No results for input(s): VITAMINB12, FOLATE, FERRITIN, TIBC, IRON, RETICCTPCT in the last 72 hours. Urine analysis:    Component Value Date/Time   COLORURINE AMBER (A) 09/01/2019 2000   APPEARANCEUR HAZY (A) 09/01/2019 2000   LABSPEC 1.029 09/01/2019 2000   PHURINE 5.0 09/01/2019 2000   GLUCOSEU NEGATIVE 09/01/2019 2000   HGBUR NEGATIVE 09/01/2019 2000   BILIRUBINUR NEGATIVE 09/01/2019 2000   KETONESUR 20 (A) 09/01/2019 2000   PROTEINUR 30 (A) 09/01/2019 2000   UROBILINOGEN 1.0 08/08/2014 2336   NITRITE NEGATIVE 09/01/2019 2000   LEUKOCYTESUR NEGATIVE 09/01/2019 2000   Sepsis Labs: @LABRCNTIP (procalcitonin:4,lacticidven:4) ) Recent Results (from the past 240 hour(s))  SARS Coronavirus 2 by RT PCR (hospital order, performed in [facility] hospital lab) Nasopharyngeal Nasopharyngeal Swab     Status: None   Collection Time: 09/01/19 11:30 PM   Specimen: Nasopharyngeal Swab  Result Value Ref Range Status   SARS Coronavirus 2 NEGATIVE NEGATIVE Final    Comment: (NOTE) SARS-CoV-2 target nucleic acids are NOT DETECTED. The SARS-CoV-2 RNA is generally detectable in upper and lower respiratory specimens during the acute phase of infection. The lowest concentration of SARS-CoV-2 viral copies this assay can detect is  250 copies / mL. A negative result does not preclude SARS-CoV-2 infection and should not be used as the sole basis for treatment or other patient management decisions.  A negative result may occur with improper specimen collection / handling, submission of specimen other than nasopharyngeal swab, presence of viral mutation(s) within the areas targeted by this assay, and inadequate number of viral copies (<250 copies / mL). A negative result must be combined with clinical observations, patient history, and epidemiological information. Fact Sheet for Patients:   11/01/19 Fact Sheet for Healthcare Providers: BoilerBrush.com.cy This test is not yet approved or cleared  by the https://pope.com/ FDA and has been authorized for detection and/or diagnosis of SARS-CoV-2 by FDA under an Emergency Use Authorization (EUA).  This EUA will remain in effect (meaning this test can be used) for the duration of the COVID-19 declaration under Section 564(b)(1) of the Act, 21 U.S.C. section 360bbb-3(b)(1), unless the authorization is terminated or  revoked sooner. Performed at [facility], [address], [place], [place] [address]      Radiological Exams on Admission: CT Head Wo Contrast  Result Date: 09/01/2019 CLINICAL DATA:  Confusion. EXAM: CT HEAD WITHOUT CONTRAST TECHNIQUE: Contiguous axial images were obtained from the base of the skull through the vertex without intravenous contrast. COMPARISON:  None. FINDINGS: Brain: No evidence of acute infarction, hemorrhage, hydrocephalus, extra-axial collection or mass lesion/mass effect. Vascular: No hyperdense vessel or unexpected calcification. Skull: Normal. Negative for fracture or focal lesion. Sinuses/Orbits: No acute finding. Other: None. IMPRESSION: No acute intracranial pathology. Electronically Signed   By: Katherine Mantle M.D.   On: 09/01/2019 20:15   DG Chest Portable 1  View  Result Date: 09/01/2019 CLINICAL DATA:  Altered mental status. Found unconscious. EXAM: PORTABLE CHEST 1 VIEW COMPARISON:  None. FINDINGS: The cardiomediastinal contours are normal. Mild defined rounded nodule projecting over the right midlung zone is favored to represent nipple shadow. Pulmonary vasculature is normal. No consolidation, pleural effusion, or pneumothorax. No acute osseous abnormalities are seen. IMPRESSION: No acute chest findings. Probable nipple shadow projecting over the right midlung zone. Consider follow-up exam with nipple markers. Electronically Signed   By: Narda Rutherford M.D.   On: 09/01/2019 15:46    Assessment/Plan Principal Problem:   Drug overdose    1. Acute encephalopathy likely from drug overdose but has become more alert awake and oriented after admission and becoming more diaphoretic could be from likely going into drug withdrawal.  For which I discussed with pharmacy and place patient on Suboxone. 2. Intractable nausea vomiting suspect could be from drug withdrawal.  Abdomen appears benign follow LFTs. 3. Diaphoresis and leukocytosis  -may be from drug withdrawal but however since patient has history of drug abuse will have to rule out infectious process for which I have ordered blood cultures.  Will follow repeat labs including metabolic panel LFTs sed rate EKG salicylate levels and Tylenol levels. 4. History of drug abuse per the report obtained from the ER physician also admits to taking heroin.  We will get social worker consult.  Presently on Suboxone.   DVT prophylaxis: Lovenox. Code Status: Full code. Family Communication: We will need to talk to family.  Patient is presently homeless. Disposition Plan: To be determined. Consults called: Child psychotherapist. Admission status: Observation.   Eduard Clos MD [facility] Pager [phone].  If 7PM-7AM, please contact night-coverage [email or web address] Password [facility]  09/02/2019, 12:50 AM

## 2019-09-02 NOTE — Consult Note (Signed)
I when by to visit I found the pt sleeping. The nurse said that she has been in shortly but out mostly. I offered silent prayers. Follow-up will be offered upon request.

## 2019-09-03 ENCOUNTER — Inpatient Hospital Stay (HOSPITAL_COMMUNITY)
Admit: 2019-09-03 | Discharge: 2019-09-03 | Disposition: A | Discharge status: Home / Self Care | Payer: Medicaid Other | Attending: Family Medicine | Admitting: Family Medicine

## 2019-09-03 DIAGNOSIS — F1193 Opioid use, unspecified with withdrawal: Secondary | ICD-10-CM

## 2019-09-03 DIAGNOSIS — R4182 Altered mental status, unspecified: Secondary | ICD-10-CM

## 2019-09-03 DIAGNOSIS — G9341 Metabolic encephalopathy: Secondary | ICD-10-CM

## 2019-09-03 DIAGNOSIS — F1123 Opioid dependence with withdrawal: Secondary | ICD-10-CM

## 2019-09-03 LAB — BASIC METABOLIC PANEL
Anion gap: 9 (ref 5–15)
BUN: 18 mg/dL (ref 6–20)
CO2: 24 mmol/L (ref 22–32)
Calcium: 8.1 mg/dL — ABNORMAL LOW (ref 8.9–10.3)
Chloride: 107 mmol/L (ref 98–111)
Creatinine, Ser: 0.64 mg/dL (ref 0.44–1.00)
GFR calc Af Amer: >60 mL/min (ref >60)
GFR calc non Af Amer: >60 mL/min (ref >60)
Glucose, Bld: 103 mg/dL — ABNORMAL HIGH (ref 70–99)
Potassium: 2.8 mmol/L — ABNORMAL LOW (ref 3.5–5.1)
Sodium: 140 mmol/L (ref 135–145)

## 2019-09-03 LAB — CBC
HCT: 35.1 % — ABNORMAL LOW (ref 36.0–46.0)
Hemoglobin: 11.8 g/dL — ABNORMAL LOW (ref 12.0–15.0)
MCH: 31.4 pg (ref 26.0–34.0)
MCHC: 33.6 g/dL (ref 30.0–36.0)
MCV: 93.4 fL (ref 80.0–100.0)
Platelets: 256 10*3/uL (ref 150–400)
RBC: 3.76 MIL/uL — ABNORMAL LOW (ref 3.87–5.11)
RDW: 13.4 % (ref 11.5–15.5)
WBC: 9.2 10*3/uL (ref 4.0–10.5)
nRBC: 0 % (ref 0.0–0.2)

## 2019-09-03 LAB — PROCALCITONIN: Procalcitonin: 0.37 ng/mL

## 2019-09-03 MED ORDER — POTASSIUM CHLORIDE CRYS ER 20 MEQ PO TBCR
40.0000 meq | EXTENDED_RELEASE_TABLET | ORAL | Status: DC
  Administered 2019-09-03: 40 meq via ORAL
  Filled 2019-09-03: qty 2

## 2019-09-03 MED ORDER — POTASSIUM CHLORIDE 10 MEQ/100ML IV SOLN
10.0000 meq | INTRAVENOUS | Status: AC
  Administered 2019-09-03 – 2019-09-04 (×6): 10 meq via INTRAVENOUS
  Filled 2019-09-03 (×6): qty 100

## 2019-09-03 NOTE — Procedures (Signed)
Patient Name: Colleen Evans  MRN: [id number]  Epilepsy Attending: Charlsie Quest  Referring Physician/Provider: Dr. Jacquelin Hawking Date: 09/03/2019 Duration: 24.33 minutes  Patient history: 34 year old female admitted for altered mental status.  EEG evaluate for seizures.  Level of alertness: Awake, asleep, comatose, lethargic  AEDs during EEG study: Lorazepam  Technical aspects: This EEG study was done with scalp electrodes positioned according to the 10-20 International system of electrode placement. Electrical activity was acquired at a sampling rate of 500Hz  and reviewed with a high frequency filter of 70Hz  and a low frequency filter of 1Hz . EEG data were recorded continuously and digitally stored.   Description: No clear posterior dominant rhythm was seen.  Sleep was characterized by vertex waves, sleep spindles (12 to 14 Hz), maximal frontocentral region.  EEG showed continuous generalized 3 to 6 Hz theta-delta slowing with overriding excessive amount of 15 to 18 Hz beta activity distributed symmetrically and diffusely.  Physiology photic driving was not seen during photic stimulation.  Hyperventilation was not performed.     ABNORMALITY -Excessive beta, generalized -Continuous slow, generalized  IMPRESSION: This study is suggestive of mild to moderate diffuse encephalopathy, nonspecific etiology. The excessive beta activity seen in the background is most likely due to the effect of benzodiazepine and is a benign EEG pattern. No seizures or epileptiform discharges were seen throughout the recording.  Corlette Ciano 

## 2019-09-03 NOTE — Progress Notes (Signed)
PROGRESS NOTE    Colleen Evans  RWE:[id number] DOB: [birth year] DOA: 09/01/2019 PCP: Patient, No Pcp Per   Brief Narrative: Colleen Evans is a 34 y.o. female with a history of polysubstance abuse and homelessness. She presented secondary to altered mental status in setting of drug overdose and opiate withdrawal.   Assessment & Plan:   Principal Problem:   Drug overdose   Drug overdose Patient with a positive UDS for benzodiazepines and amphetamines. Patient uses heroin but UDS was negative for opiates.  Opiate withdrawal Possible concomitant benzodiazepine withdrawal. Improved from admission but patient does not appear to be completely back to a normal baseline. Unsure of what her actual baseline is. -Continue Ativan IV prn  Acute metabolic encephalopathy Likely secondary to initial overdose/withdrawal. Improving. -Will observe another 24 hours for improvement in mental status -EEG -Psychiatry consult  Hypokalemia Patient is not accepting medication therapy. Likely secondary to recent vomiting. -Kdur for repletion if patient will take   DVT prophylaxis: Lovenox Code Status:   Code Status: Full Code Family Communication: None Disposition Plan: Discharge likely to street vs shelter if patient agreeable to discuss with social work. Anticipate discharge in 1-2 days if mental status improves.   Consultants:   Psychiatry  Procedures:   EEG  Antimicrobials:  None    Subjective: Hungry. Only communicating by nodding and shaking her head. Does not want to talk.  Objective: Vitals:   09/02/19 1437 09/02/19 1455 09/02/19 2300 09/03/19 0248  BP: 118/89 96/65 (!) 111/94 123/89  Pulse: 77 66 81 60  Resp: 18 18 18 18   Temp:  99.1 F (37.3 C) (!) 97.5 F (36.4 C) 99.6 F (37.6 C)  TempSrc:  Oral Oral Oral  SpO2: 100% 100% 97% 97%  Weight:      Height:       No intake or output data in the 24 hours ending 09/03/19 0903 Filed Weights   09/02/19 0808    Weight: 59 kg    Examination:  General exam: Appears calm and comfortable Respiratory system: Clear to auscultation. Respiratory effort normal. Cardiovascular system: S1 & S2 heard, RRR. No murmurs, rubs, gallops or clicks. Gastrointestinal system: Abdomen is nondistended, soft and nontender. No organomegaly or masses felt. Normal bowel sounds heard. Central nervous system: Alert. No focal neurological deficits. Musculoskeletal: No edema. No calf tenderness Skin: No cyanosis. No rashes Psychiatry: Judgement and insight appear impaired.    Data Reviewed: I have personally reviewed following labs and imaging studies  CBC Lab Results  Component Value Date   WBC 9.2 09/03/2019   RBC 3.76 (L) 09/03/2019   HGB 11.8 (L) 09/03/2019   HCT 35.1 (L) 09/03/2019   MCV 93.4 09/03/2019   MCH 31.4 09/03/2019   PLT 256 09/03/2019   MCHC 33.6 09/03/2019   RDW 13.4 09/03/2019   LYMPHSABS 1.3 09/02/2019   MONOABS 0.9 09/02/2019   EOSABS 0.0 09/02/2019   BASOSABS 0.0 09/02/2019     Last metabolic panel Lab Results  Component Value Date   NA 138 09/02/2019   K 3.3 (L) 09/02/2019   CL 107 09/02/2019   CO2 20 (L) 09/02/2019   BUN 15 09/02/2019   CREATININE 0.62 09/02/2019   GLUCOSE 124 (H) 09/02/2019   GFRNONAA >60 09/02/2019   GFRAA >60 09/02/2019   CALCIUM 8.2 (L) 09/02/2019   PROT 6.5 09/02/2019   ALBUMIN 3.4 (L) 09/02/2019   BILITOT 0.8 09/02/2019   ALKPHOS 47 09/02/2019   AST 16 09/02/2019   ALT 10 09/02/2019  ANIONGAP 11 09/02/2019    CBG (last 3)  No results for input(s): GLUCAP in the last 72 hours.   GFR: Estimated Creatinine Clearance: 90 mL/min (by C-G formula based on SCr of 0.62 mg/dL).  Coagulation Profile: No results for input(s): INR, PROTIME in the last 168 hours.  Recent Results (from the past 240 hour(s))  SARS Coronavirus 2 by RT PCR (hospital order, performed in [facility] hospital lab) Nasopharyngeal Nasopharyngeal Swab     Status: None    Collection Time: 09/01/19 11:30 PM   Specimen: Nasopharyngeal Swab  Result Value Ref Range Status   SARS Coronavirus 2 NEGATIVE NEGATIVE Final    Comment: (NOTE) SARS-CoV-2 target nucleic acids are NOT DETECTED. The SARS-CoV-2 RNA is generally detectable in upper and lower respiratory specimens during the acute phase of infection. The lowest concentration of SARS-CoV-2 viral copies this assay can detect is 250 copies / mL. A negative result does not preclude SARS-CoV-2 infection and should not be used as the sole basis for treatment or other patient management decisions.  A negative result may occur with improper specimen collection / handling, submission of specimen other than nasopharyngeal swab, presence of viral mutation(s) within the areas targeted by this assay, and inadequate number of viral copies (<250 copies / mL). A negative result must be combined with clinical observations, patient history, and epidemiological information. Fact Sheet for Patients:   BoilerBrush.com.cy Fact Sheet for Healthcare Providers: https://pope.com/ This test is not yet approved or cleared  by the [place] FDA and has been authorized for detection and/or diagnosis of SARS-CoV-2 by FDA under an Emergency Use Authorization (EUA).  This EUA will remain in effect (meaning this test can be used) for the duration of the COVID-19 declaration under Section 564(b)(1) of the Act, 21 U.S.C. section 360bbb-3(b)(1), unless the authorization is terminated or revoked sooner. Performed at [facility], [address], [place], [place] [address]   Blood culture (routine x 2)     Status: None (Preliminary result)   Collection Time: 09/02/19 12:20 AM   Specimen: BLOOD  Result Value Ref Range Status   Specimen Description   Final    BLOOD LEFT ANTECUBITAL Performed at [facility], [address], [place], [place] [address]     Special Requests   Final    BOTTLES DRAWN AEROBIC AND ANAEROBIC Blood Culture adequate volume Performed at [facility], [address] [place], [place] [address]    Culture   Final    NO GROWTH 1 DAY Performed at [facility], [address] [place], [place] [address]    Report Status PENDING  Incomplete  Blood culture (routine x 2)     Status: None (Preliminary result)   Collection Time: 09/02/19 12:20 AM   Specimen: BLOOD  Result Value Ref Range Status   Specimen Description   Final    BLOOD RIGHT ANTECUBITAL Performed at [facility], [address] [place], [place] [address]    Special Requests   Final    BOTTLES DRAWN AEROBIC AND ANAEROBIC Blood Culture adequate volume Performed at [facility], [address] [place], [place] [address]    Culture   Final    NO GROWTH 1 DAY Performed at [facility], [address], [place], [place] [address]    Report Status PENDING  Incomplete        Radiology Studies: CT Head Wo Contrast  Result Date: 09/01/2019 CLINICAL  DATA:  Confusion. EXAM: CT HEAD WITHOUT CONTRAST TECHNIQUE: Contiguous axial images were obtained from the base of the skull through the vertex without intravenous contrast. COMPARISON:  None. FINDINGS: Brain: No evidence of acute infarction, hemorrhage, hydrocephalus, extra-axial collection or mass lesion/mass effect. Vascular: No hyperdense vessel or unexpected calcification. Skull: Normal. Negative for fracture or focal lesion. Sinuses/Orbits: No acute finding. Other: None. IMPRESSION: No acute intracranial pathology. Electronically Signed   By: Constance Holster M.D.   On: 09/01/2019 20:15   DG Chest Portable 1 View  Result Date: 09/01/2019 CLINICAL DATA:  Altered mental status. Found unconscious. EXAM: PORTABLE CHEST 1 VIEW COMPARISON:  None. FINDINGS: The cardiomediastinal contours are normal. Mild defined rounded nodule projecting over the  right midlung zone is favored to represent nipple shadow. Pulmonary vasculature is normal. No consolidation, pleural effusion, or pneumothorax. No acute osseous abnormalities are seen. IMPRESSION: No acute chest findings. Probable nipple shadow projecting over the right midlung zone. Consider follow-up exam with nipple markers. Electronically Signed   By: Keith Rake M.D.   On: 09/01/2019 15:46        Scheduled Meds: . buprenorphine-naloxone  1 tablet Sublingual BID  . enoxaparin (LOVENOX) injection  40 mg Subcutaneous Q24H   Continuous Infusions:   LOS: 1 day     Cordelia Poche, MD [facility] 09/03/2019, 9:03 AM  If 7PM-7AM, please contact night-coverage [email or web address]

## 2019-09-03 NOTE — Plan of Care (Signed)
  Problem: Clinical Measurements: Goal: Ability to maintain clinical measurements within normal limits will improve Outcome: Progressing   Problem: Clinical Measurements: Goal: Will remain free from infection Outcome: Progressing   

## 2019-09-03 NOTE — TOC Initial Note (Signed)
Transition of Care [facility]) - Initial/Assessment Note    Patient Details  Name: Colleen Evans MRN: [id number] Date of Birth: 12-Jun-1985  Transition of Care [facility] CM/SW Contact:    Ida Rogue, LCSW Phone Number: 09/03/2019, 2:39 PM  Clinical Narrative:   Nursing approached me about seeing patient to talk about disposition planning as her understanding was patient is currently homeless.  Went to see patient.  She was laying down with eyes closed-responded to my questions with mumbles and did not open her eyes.  Returned later.  Patient was sitting up eating.  Again I attempted to engage her.  She did not respond to my inquiries verbally.  I asked if she would like me to come back later, and she nodded in agreement.  I let her know to let the staff know when she wants to see CSW again. TOC will continue to follow during the course of hospitalization.               Expected Discharge Plan:  (unknown) Barriers to Discharge: Other (comment) (Patient unwilling/unable to engaged in dispositional planning)   Patient Goals and CMS Choice        Expected Discharge Plan and Services Expected Discharge Plan:  (unknown)                                              Prior Living Arrangements/Services                       Activities of Daily Living Home Assistive Devices/Equipment: None ADL Screening (condition at time of admission) Patient's cognitive ability adequate to safely complete daily activities?: No (patient very drowsy-will only answer a few questions.  Easily falls back to sleep) Is the patient deaf or have difficulty hearing?: No Does the patient have difficulty seeing, even when wearing glasses/contacts?: No Does the patient have difficulty concentrating, remembering, or making decisions?: Yes Patient able to express need for assistance with ADLs?: Yes Does the patient have difficulty dressing or bathing?: Yes Independently performs ADLs?:  No Communication: Independent Dressing (OT): Needs assistance Is this a change from baseline?: Change from baseline, expected to last <3days Grooming: Needs assistance Is this a change from baseline?: Change from baseline, expected to last <3 days Feeding: Needs assistance Is this a change from baseline?: Change from baseline, expected to last <3 days Bathing: Needs assistance Is this a change from baseline?: Change from baseline, expected to last <3 days Toileting: Needs assistance Is this a change from baseline?: Change from baseline, expected to last <3 days In/Out Bed: Needs assistance Is this a change from baseline?: Change from baseline, expected to last <3 days Walks in Home: Needs assistance Is this a change from baseline?: Change from baseline, expected to last <3 days Does the patient have difficulty walking or climbing stairs?: Yes Weakness of Legs: Both Weakness of Arms/Hands: None  Permission Sought/Granted                  Emotional Assessment              Admission diagnosis:  Dehydration [E86.0] Drug overdose [T50.901A] Heroin use [F11.90] Altered mental status, unspecified altered mental status type [R41.82] Intractable vomiting with nausea, unspecified vomiting type [R11.2] Patient Active Problem List   Diagnosis Date Noted  . Drug overdose 09/02/2019  . Intractable  vomiting   . Miscarriage 02/26/2018   PCP:  Patient, No Pcp Per Pharmacy:   [id number] [place], [place] [address] Phone: [phone] Fax: [place], [address] [place] [address] Phone: [phone] Fax: [phone]     Social Determinants of Health (SDOH) Interventions    Readmission Risk Interventions No flowsheet data found.

## 2019-09-03 NOTE — Progress Notes (Signed)
Chaplain followed up visit.  Patient was awake and was given beverages.  "I am thirsty. I haven't had anything to drink in days."  Patient eyes were mostly closed as she drank beverages.  When she did open them she did not make eye contact with chaplain. Chaplain asked gently about patient's situation and got no responses but two nods of yes.  Chaplain asked if the patient had been to a hospital before. She nodded yes. Chaplain asked if overdose was accidental, and patient nodded yes. Patient reclined back after eating icy and drinking soda, and pulled up covers, with closed eyes.  Chaplain said, Do you want to rest?" Patient said yes and Chaplain left. Rev. Lynnell Chad  pager [phone]

## 2019-09-03 NOTE — Progress Notes (Signed)
EEG Completed; Results Pending  

## 2019-09-03 NOTE — Consult Note (Signed)
  Patient has declined psychiatry consult at this time. She has also refused medications. Per chart review she has a mild to moderate encephalopathy likely 2/t drug overdose. Patient has multiple psychosocial needs, and will need social work consult in which she has declined to talk with also. Please re consult once patient is willing to be assessed and evaluated. Thank you!

## 2019-09-04 LAB — PROCALCITONIN: Procalcitonin: 0.28 ng/mL

## 2019-09-04 NOTE — Discharge Summary (Signed)
Physician Discharge Summary  Colleen Evans:[id number] DOB: September 16, 1985 DOA: 09/01/2019  PCP: Patient, No Pcp Per  Admit date: 09/01/2019 Discharge date: 09/04/2019  Admitted From: Street Disposition: Self care  Recommendations for Outpatient Follow-up:  1. Recommend outpatient substance abuse program  Discharge Condition: Stable CODE STATUS: Full code Diet recommendation: Regular diet   Brief/Interim Summary:  Admission HPI written by Gean Birchwood, MD  Chief Complaint: Altered mental status.  HPI: Colleen Evans is a 34 y.o. female with history of anxiety back pain per the report was found to be unresponsive inside the car and altered mental status by EMS.  At the time patient was febrile with temperature 101 F.  Patient was brought to the ER.   Hospital course:  Drug overdose Patient with a positive UDS for benzodiazepines and amphetamines. Patient uses heroin but UDS was negative for opiates. Resolved.  Opiate withdrawal Possible concomitant benzodiazepine withdrawal. Improved from admission and appears to be at baseline. No significant withdrawal symptoms prior to discharge.  Acute metabolic encephalopathy Likely secondary to initial overdose/withdrawal. Improving. EEG with non-specific findings but no evidence of seizures. Mentation improved prior to discharge. Patient declined psychiatry evaluation.  Hypokalemia Patient was initially decliningmedication therapy. Hypokalemia likely secondary to recent vomiting. Given supplementation prior to discharge.  Discharge Diagnoses:  Principal Problem:   Drug overdose Active Problems:   Opiate withdrawal ([place])   Acute metabolic encephalopathy    Discharge Instructions   Allergies as of 09/04/2019   No Known Allergies     Medication List    TAKE these medications   acetaminophen 325 MG tablet Commonly known as: TYLENOL Take 650 mg by mouth every 6 (six) hours as needed for moderate pain.     ALPRAZolam 1 MG tablet Commonly known as: XANAX Take 1 mg by mouth 4 (four) times daily as needed for anxiety.   amphetamine-dextroamphetamine 20 MG tablet Commonly known as: ADDERALL Take 20 mg by mouth in the morning, at noon, and at bedtime.   citalopram 40 MG tablet Commonly known as: CELEXA Take 40 mg by mouth daily.   ibuprofen 200 MG tablet Commonly known as: ADVIL Take 200 mg by mouth every 6 (six) hours as needed for mild pain.       No Known Allergies  Consultations:  Psychiatry   Procedures/Studies: CT Head Wo Contrast  Result Date: 09/01/2019 CLINICAL DATA:  Confusion. EXAM: CT HEAD WITHOUT CONTRAST TECHNIQUE: Contiguous axial images were obtained from the base of the skull through the vertex without intravenous contrast. COMPARISON:  None. FINDINGS: Brain: No evidence of acute infarction, hemorrhage, hydrocephalus, extra-axial collection or mass lesion/mass effect. Vascular: No hyperdense vessel or unexpected calcification. Skull: Normal. Negative for fracture or focal lesion. Sinuses/Orbits: No acute finding. Other: None. IMPRESSION: No acute intracranial pathology. Electronically Signed   By: Constance Holster M.D.   On: 09/01/2019 20:15   DG Chest Portable 1 View  Result Date: 09/01/2019 CLINICAL DATA:  Altered mental status. Found unconscious. EXAM: PORTABLE CHEST 1 VIEW COMPARISON:  None. FINDINGS: The cardiomediastinal contours are normal. Mild defined rounded nodule projecting over the right midlung zone is favored to represent nipple shadow. Pulmonary vasculature is normal. No consolidation, pleural effusion, or pneumothorax. No acute osseous abnormalities are seen. IMPRESSION: No acute chest findings. Probable nipple shadow projecting over the right midlung zone. Consider follow-up exam with nipple markers. Electronically Signed   By: Keith Rake M.D.   On: 09/01/2019 15:46   EEG adult  Result Date: 09/03/2019  Charlsie Quest, MD     09/03/2019  1:24  PM Patient Name: Colleen Evans MRN: [id number] Epilepsy Attending: Charlsie Quest Referring Physician/Provider: Dr. Jacquelin Hawking Date: 09/03/2019 Duration: 24.33 minutes Patient history: 34 year old female admitted for altered mental status.  EEG evaluate for seizures. Level of alertness: Awake, asleep, comatose, lethargic AEDs during EEG study: Lorazepam Technical aspects: This EEG study was done with scalp electrodes positioned according to the 10-20 International system of electrode placement. Electrical activity was acquired at a sampling rate of 500Hz  and reviewed with a high frequency filter of 70Hz  and a low frequency filter of 1Hz . EEG data were recorded continuously and digitally stored. Description: No clear posterior dominant rhythm was seen.  Sleep was characterized by vertex waves, sleep spindles (12 to 14 Hz), maximal frontocentral region.  EEG showed continuous generalized 3 to 6 Hz theta-delta slowing with overriding excessive amount of 15 to 18 Hz beta activity distributed symmetrically and diffusely.  Physiology photic driving was not seen during photic stimulation.  Hyperventilation was not performed.   ABNORMALITY -Excessive beta, generalized -Continuous slow, generalized IMPRESSION: This study is suggestive of mild to moderate diffuse encephalopathy, nonspecific etiology. The excessive beta activity seen in the background is most likely due to the effect of benzodiazepine and is a benign EEG pattern. No seizures or epileptiform discharges were seen throughout the recording. Priyanka      Subjective: No issues. No nausea.  Discharge Exam: Vitals:   09/04/19 0417 09/04/19 1347  BP: (!) 123/92 118/85  Pulse: (!) 57 84  Resp: 18 17  Temp: 98.7 F (37.1 C) 98.9 F (37.2 C)  SpO2: 98% 100%   Vitals:   09/03/19 1338 09/03/19 2035 09/04/19 0417 09/04/19 1347  BP: 108/74 103/76 (!) 123/92 118/85  Pulse: 84 64 (!) 57 84  Resp: 20 16 18 17   Temp: 98.3 F (36.8 C) 98.8  F (37.1 C) 98.7 F (37.1 C) 98.9 F (37.2 C)  TempSrc: Axillary   Oral  SpO2: 97% 97% 98% 100%  Weight:      Height:        General: Pt is alert, awake, not in acute distress Cardiovascular: RRR, S1/S2 +, no rubs, no gallops Respiratory: CTA bilaterally, no wheezing, no rhonchi Abdominal: Soft, NT, ND, bowel sounds + Extremities: no edema, no cyanosis    The results of significant diagnostics from this hospitalization (including imaging, microbiology, ancillary and laboratory) are listed below for reference.     Microbiology: Recent Results (from the past 240 hour(s))  SARS Coronavirus 2 by RT PCR (hospital order, performed in [facility] hospital lab) Nasopharyngeal Nasopharyngeal Swab     Status: None   Collection Time: 09/01/19 11:30 PM   Specimen: Nasopharyngeal Swab  Result Value Ref Range Status   SARS Coronavirus 2 NEGATIVE NEGATIVE Final    Comment: (NOTE) SARS-CoV-2 target nucleic acids are NOT DETECTED. The SARS-CoV-2 RNA is generally detectable in upper and lower respiratory specimens during the acute phase of infection. The lowest concentration of SARS-CoV-2 viral copies this assay can detect is 250 copies / mL. A negative result does not preclude SARS-CoV-2 infection and should not be used as the sole basis for treatment or other patient management decisions.  A negative result may occur with improper specimen collection / handling, submission of specimen other than nasopharyngeal swab, presence of viral mutation(s) within the areas targeted by this assay, and inadequate number of viral copies (<250 copies / mL). A negative result must be  combined with clinical observations, patient history, and epidemiological information. Fact Sheet for Patients:   BoilerBrush.com.cy Fact Sheet for Healthcare Providers: https://pope.com/ This test is not yet approved or cleared  by the [place] FDA and has been  authorized for detection and/or diagnosis of SARS-CoV-2 by FDA under an Emergency Use Authorization (EUA).  This EUA will remain in effect (meaning this test can be used) for the duration of the COVID-19 declaration under Section 564(b)(1) of the Act, 21 U.S.C. section 360bbb-3(b)(1), unless the authorization is terminated or revoked sooner. Performed at [facility], [address] [place], [place] [address]   Blood culture (routine x 2)     Status: None (Preliminary result)   Collection Time: 09/02/19 12:20 AM   Specimen: BLOOD  Result Value Ref Range Status   Specimen Description   Final    BLOOD LEFT ANTECUBITAL Performed at [facility], [address] [place], [place] [address]    Special Requests   Final    BOTTLES DRAWN AEROBIC AND ANAEROBIC Blood Culture adequate volume Performed at [facility], [address], [place], [place] [address]    Culture   Final    NO GROWTH 2 DAYS Performed at [facility], [address], [place], [place] [address]    Report Status PENDING  Incomplete  Blood culture (routine x 2)     Status: None (Preliminary result)   Collection Time: 09/02/19 12:20 AM   Specimen: BLOOD  Result Value Ref Range Status   Specimen Description   Final    BLOOD RIGHT ANTECUBITAL Performed at [facility], [address] [place], [place] [address]    Special Requests   Final    BOTTLES DRAWN AEROBIC AND ANAEROBIC Blood Culture adequate volume Performed at [facility], [address], [place], [place] [address]    Culture   Final    NO GROWTH 2 DAYS Performed at [facility], [address], [place], [place] [address]    Report Status PENDING  Incomplete     Labs: BNP (last 3 results) No results for input(s): BNP in the last 8760 hours. Basic Metabolic Panel: Recent Labs  Lab 09/01/19 1503 09/02/19 0432 09/02/19 1524 09/03/19 0821  NA  140 138  --  140  K 3.7 2.9* 3.3* 2.8*  CL 105 107  --  107  CO2 23 20*  --  24  GLUCOSE 129* 124*  --  103*  BUN 15 15  --  18  CREATININE 0.97 0.62  --  0.64  CALCIUM 9.1 8.2*  --  8.1*   Liver Function Tests: Recent Labs  Lab 09/01/19 1503 09/02/19 0432  AST 19 16  ALT 14 10  ALKPHOS 63 47  BILITOT 0.7 0.8  PROT 7.9 6.5  ALBUMIN 4.1 3.4*   No results for input(s): LIPASE, AMYLASE in the last 168 hours. No results for input(s): AMMONIA in the last 168 hours. CBC: Recent Labs  Lab 09/01/19 1503 09/02/19 0432 09/03/19 0821  WBC 18.1* 11.9* 9.2  NEUTROABS 16.0* 9.6*  --   HGB 14.3 11.5* 11.8*  HCT 43.5 34.9* 35.1*  MCV 92.2 93.3 93.4  PLT 432* 322 256   Cardiac Enzymes: Recent Labs  Lab 09/01/19 1503 09/02/19 0432  CKTOTAL 35* 211   BNP: Invalid input(s): POCBNP CBG: No results for input(s): GLUCAP in the last 168 hours. D-Dimer No results for input(s): DDIMER in the last 72  hours. Hgb A1c No results for input(s): HGBA1C in the last 72 hours. Lipid Profile No results for input(s): CHOL, HDL, LDLCALC, TRIG, CHOLHDL, LDLDIRECT in the last 72 hours. Thyroid function studies No results for input(s): TSH, T4TOTAL, T3FREE, THYROIDAB in the last 72 hours.  Invalid input(s): FREET3 Anemia work up No results for input(s): VITAMINB12, FOLATE, FERRITIN, TIBC, IRON, RETICCTPCT in the last 72 hours. Urinalysis    Component Value Date/Time   COLORURINE AMBER (A) 09/01/2019 2000   APPEARANCEUR HAZY (A) 09/01/2019 2000   LABSPEC 1.029 09/01/2019 2000   PHURINE 5.0 09/01/2019 2000   GLUCOSEU NEGATIVE 09/01/2019 2000   HGBUR NEGATIVE 09/01/2019 2000   BILIRUBINUR NEGATIVE 09/01/2019 2000   KETONESUR 20 (A) 09/01/2019 2000   PROTEINUR 30 (A) 09/01/2019 2000   UROBILINOGEN 1.0 08/08/2014 2336   NITRITE NEGATIVE 09/01/2019 2000   LEUKOCYTESUR NEGATIVE 09/01/2019 2000   Sepsis Labs Invalid input(s): PROCALCITONIN,  WBC,  LACTICIDVEN Microbiology Recent Results  (from the past 240 hour(s))  SARS Coronavirus 2 by RT PCR (hospital order, performed in [facility] hospital lab) Nasopharyngeal Nasopharyngeal Swab     Status: None   Collection Time: 09/01/19 11:30 PM   Specimen: Nasopharyngeal Swab  Result Value Ref Range Status   SARS Coronavirus 2 NEGATIVE NEGATIVE Final    Comment: (NOTE) SARS-CoV-2 target nucleic acids are NOT DETECTED. The SARS-CoV-2 RNA is generally detectable in upper and lower respiratory specimens during the acute phase of infection. The lowest concentration of SARS-CoV-2 viral copies this assay can detect is 250 copies / mL. A negative result does not preclude SARS-CoV-2 infection and should not be used as the sole basis for treatment or other patient management decisions.  A negative result may occur with improper specimen collection / handling, submission of specimen other than nasopharyngeal swab, presence of viral mutation(s) within the areas targeted by this assay, and inadequate number of viral copies (<250 copies / mL). A negative result must be combined with clinical observations, patient history, and epidemiological information. Fact Sheet for Patients:   BoilerBrush.com.cy Fact Sheet for Healthcare Providers: https://pope.com/ This test is not yet approved or cleared  by the [place] FDA and has been authorized for detection and/or diagnosis of SARS-CoV-2 by FDA under an Emergency Use Authorization (EUA).  This EUA will remain in effect (meaning this test can be used) for the duration of the COVID-19 declaration under Section 564(b)(1) of the Act, 21 U.S.C. section 360bbb-3(b)(1), unless the authorization is terminated or revoked sooner. Performed at [facility], [address], [place], [place] [address]   Blood culture (routine x 2)     Status: None (Preliminary result)   Collection Time: 09/02/19 12:20 AM   Specimen: BLOOD  Result  Value Ref Range Status   Specimen Description   Final    BLOOD LEFT ANTECUBITAL Performed at [facility], [address] [place], [place] [address]    Special Requests   Final    BOTTLES DRAWN AEROBIC AND ANAEROBIC Blood Culture adequate volume Performed at [facility], [address], [place], [place] [address]    Culture   Final    NO GROWTH 2 DAYS Performed at [facility], [address] [place], [place] [address]    Report Status PENDING  Incomplete  Blood culture (routine x 2)     Status: None (Preliminary result)   Collection Time: 09/02/19 12:20 AM   Specimen: BLOOD  Result Value Ref Range Status  Specimen Description   Final    BLOOD RIGHT ANTECUBITAL Performed at [facility], [address] [place], [place] [address]    Special Requests   Final    BOTTLES DRAWN AEROBIC AND ANAEROBIC Blood Culture adequate volume Performed at [facility], [address] [place], [place] [address]    Culture   Final    NO GROWTH 2 DAYS Performed at [facility], [address] [place], [place] [address]    Report Status PENDING  Incomplete     Time coordinating discharge: 35 minutes  SIGNED:   Jacquelin Hawkingalph Judia Arnott, MD [facility] 09/04/2019, 2:15 PM

## 2019-09-04 NOTE — TOC Transition Note (Signed)
Transition of Care [facility]) - CM/SW Discharge Note   Patient Details  Name: Colleen Evans MRN: [id number] Date of Birth: Apr 06, 1985  [facility] CM/SW Contact:  Darleene Cleaver, LCSW Phone Number: 09/04/2019, 3:10 PM   Clinical Narrative:     CSW was informed that patient was interested in substance abuse resources, and homeless shelters.  CSW provided resources for homeless shelters, substance treatment, hot meals, Emergency/financial assistance.  Previous ED CSW provided patient information on safe needle exchange sites.  Unfortunately there is not much else, CSW can do for patient.   Final next level of care: Homeless Shelter Barriers to Discharge: Barriers Resolved   Patient Goals and CMS Choice Patient states their goals for this hospitalization and ongoing recovery are:: To discharge from hospital CMS Medicare.gov Compare Post Acute Care list provided to:: Patient Choice offered to / list presented to : Patient  Discharge Placement  Homeless shelter                     Discharge Plan and Services                  DME Agency: NA                  Social Determinants of Health (SDOH) Interventions     Readmission Risk Interventions No flowsheet data found.

## 2019-09-04 NOTE — Plan of Care (Signed)
  Problem: Education: Goal: Knowledge of General Education information will improve Description Including pain rating scale, medication(s)/side effects and non-pharmacologic comfort measures Outcome: Progressing   Problem: Health Behavior/Discharge Planning: Goal: Ability to manage health-related needs will improve Outcome: Progressing   

## 2019-09-07 LAB — CULTURE, BLOOD (ROUTINE X 2)
Culture: NO GROWTH
Culture: NO GROWTH
Special Requests: ADEQUATE
Special Requests: ADEQUATE

## 2021-12-19 IMAGING — CT CT HEAD W/O CM
3 series · 16 of 47 positions shown, 19 images · non-contrast
Comparison: None.

CLINICAL DATA: Confusion.

EXAM:
CT HEAD WITHOUT CONTRAST
TECHNIQUE: Contiguous axial images were obtained from the base of the skull
through the vertex without intravenous contrast.

[Series 2: head wo · axial · 0.47mm/px · z∈[-196,-61]mm · 10 of 33 slices shown, 13 images]
[im 3/33  brain]
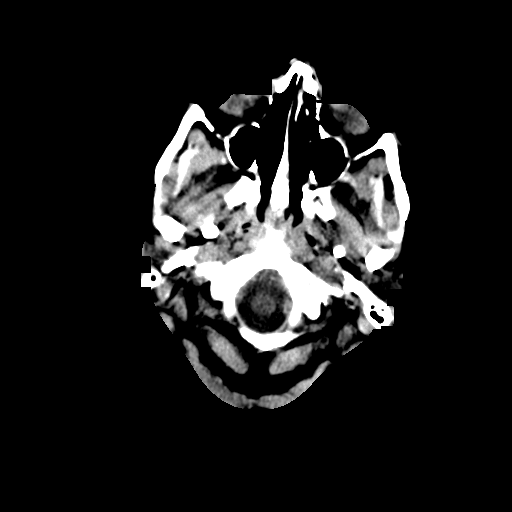
[im 3/33  bone]
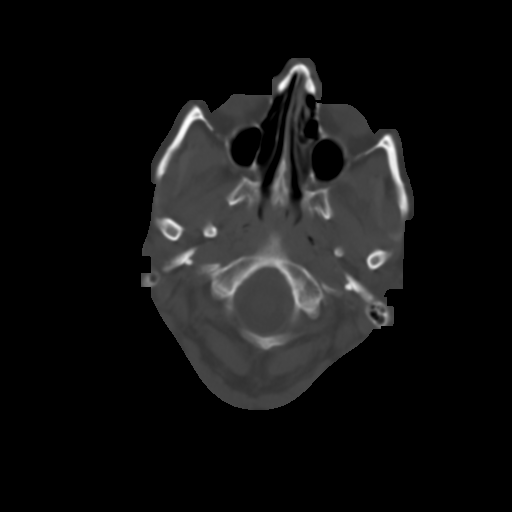
[im 6/33  brain]
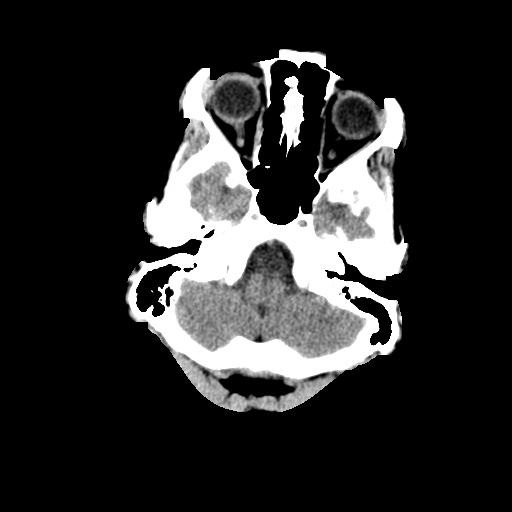
[im 9/33  brain]
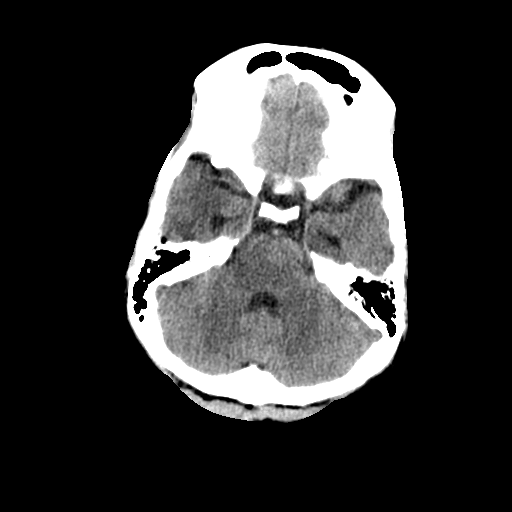
[im 12/33  brain]
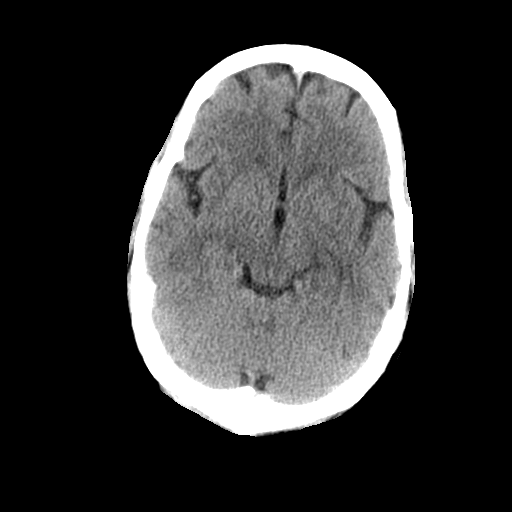
[im 15/33  brain]
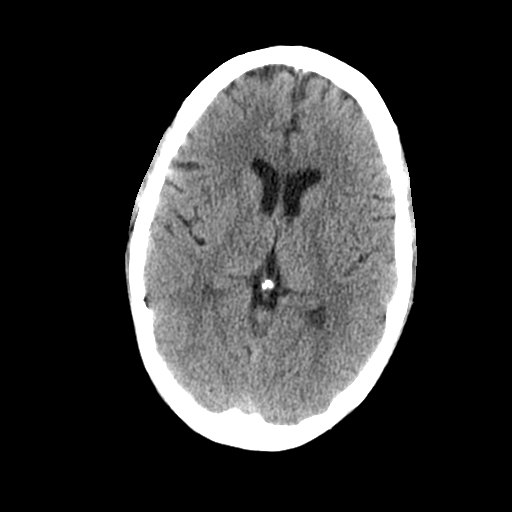
[im 15/33  bone]
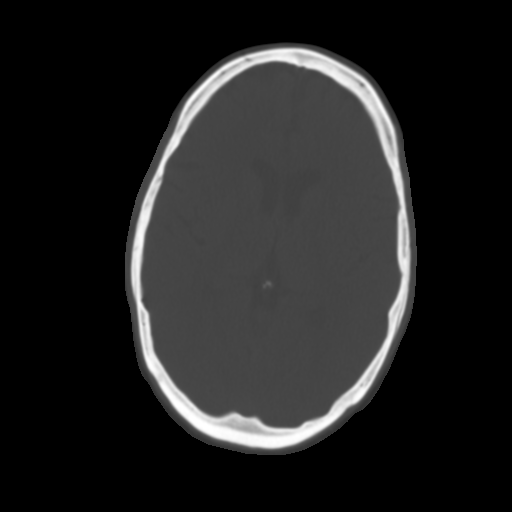
[im 18/33  brain]
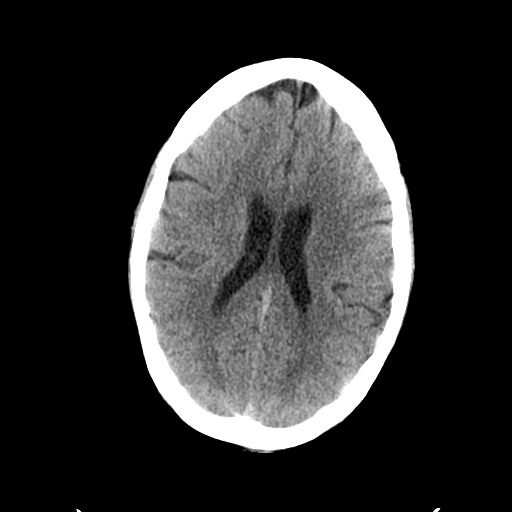
[im 21/33  brain]
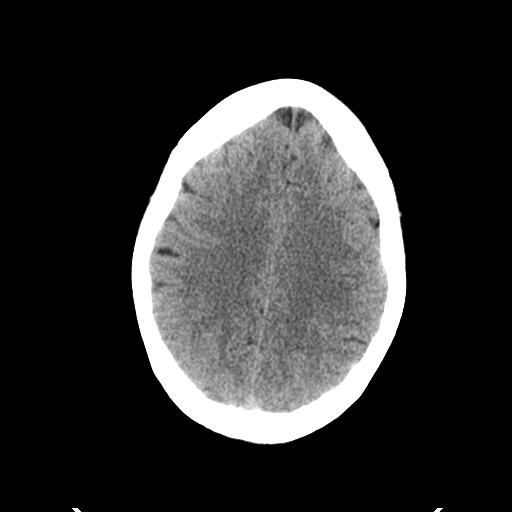
[im 25/33  brain]
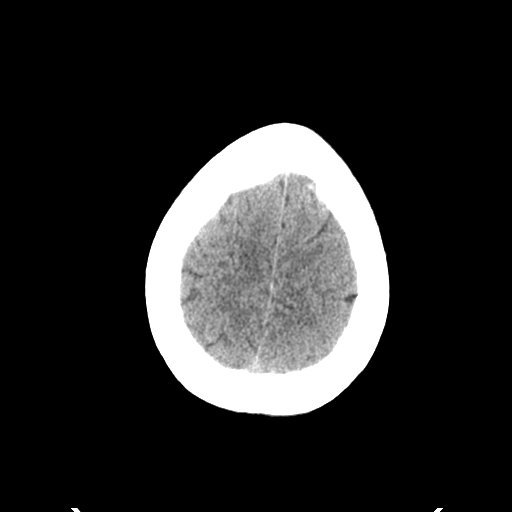
[im 27/33  brain]
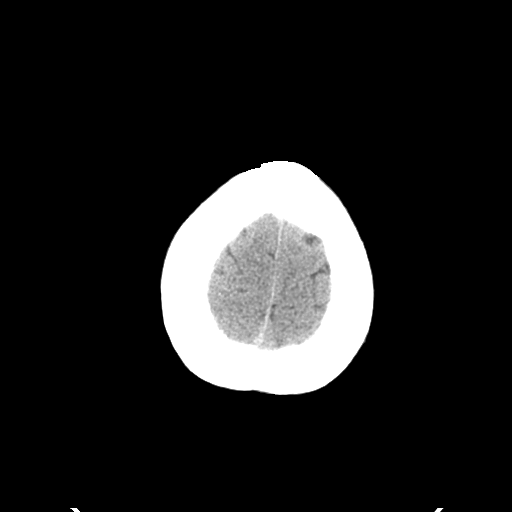
[im 27/33  bone]
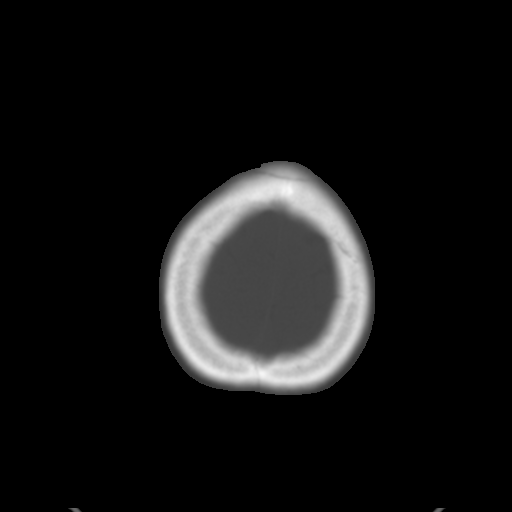
[im 30/33  brain]
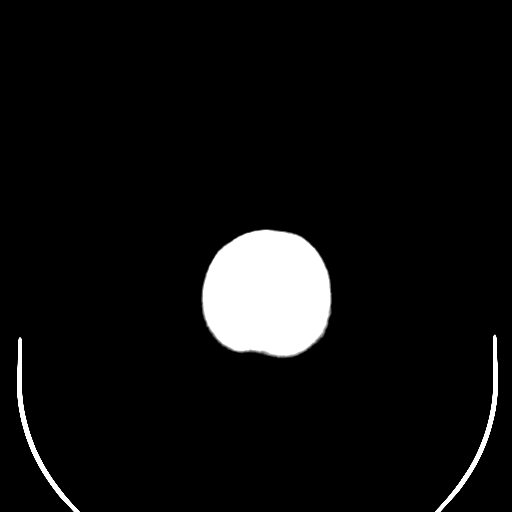

[Series 4: coronal soft tissue · coronal · 0.34mm/px · 3 of 73 slices shown]
[im 25/73  brain]
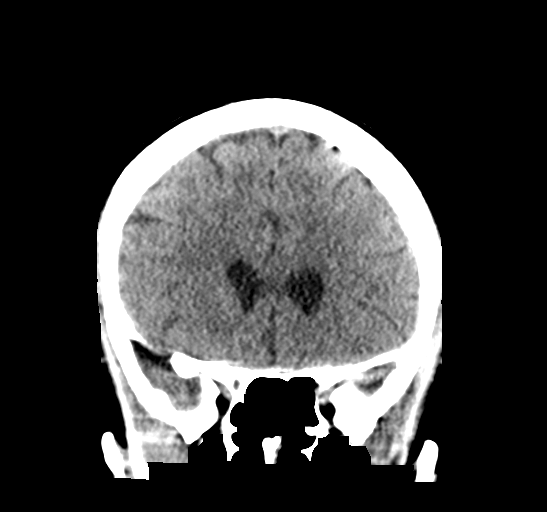
[im 33/73  brain]
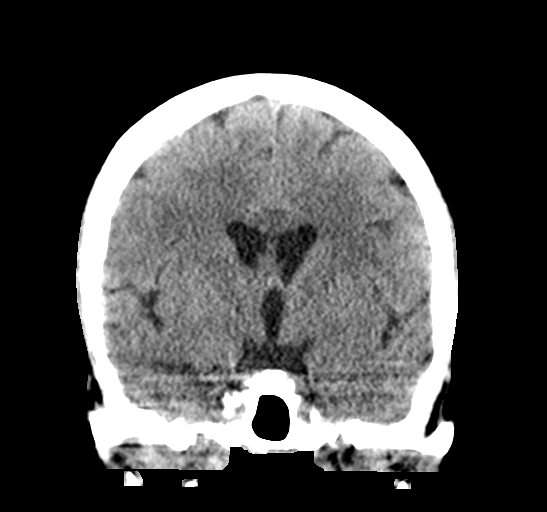
[im 41/73  brain]
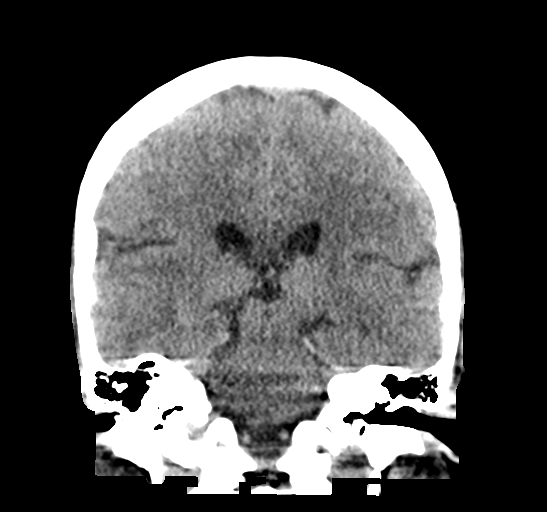

[Series 5: sagittal soft tissue · sagittal · 0.35mm/px · 3 of 58 slices shown]
[im 20/58  brain]
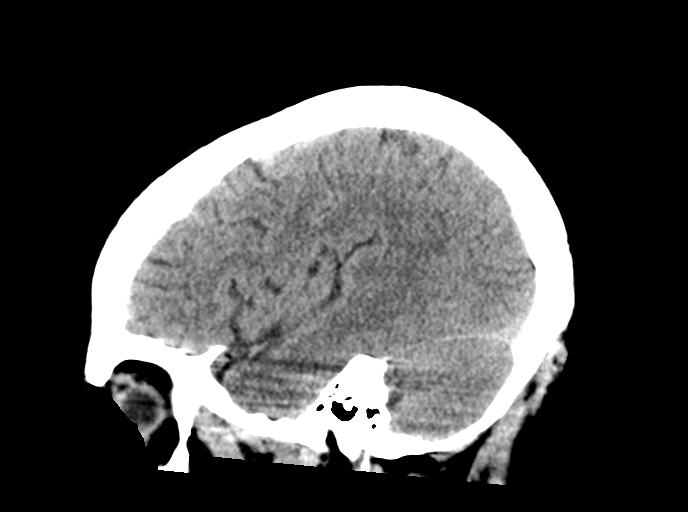
[im 29/58  brain]
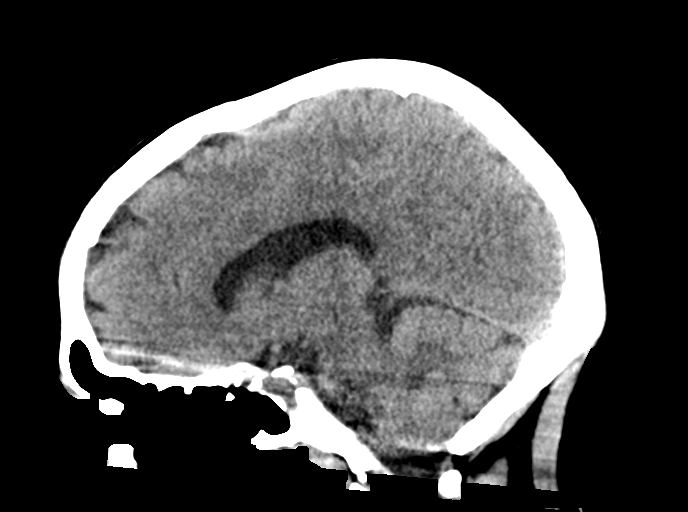
[im 39/58  brain]
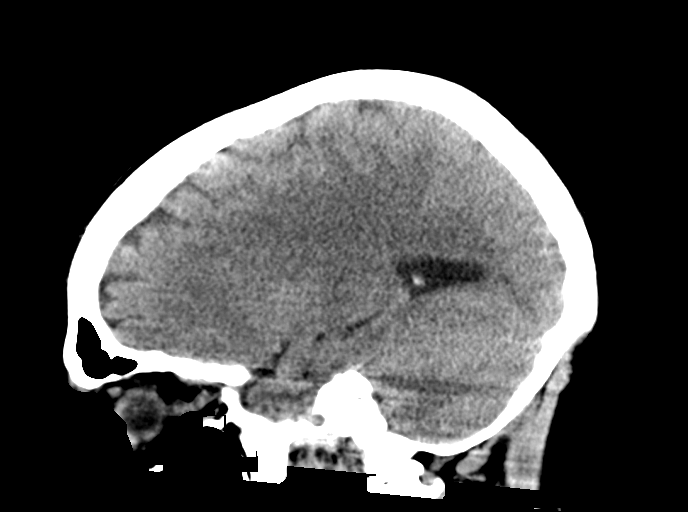

[16 of 47 positions shown; findings below may reference images not displayed]

FINDINGS: Brain: No evidence of acute infarction, hemorrhage, hydrocephalus,
extra-axial collection or mass lesion/mass effect.

Vascular: No hyperdense vessel or unexpected calcification.

Skull: Normal. Negative for fracture or focal lesion.

Sinuses/Orbits: No acute finding.

Other: None.
IMPRESSION: No acute intracranial pathology.
